# Patient Record
Sex: Female | Born: 1955 | Race: Black or African American | Hispanic: No | Smoking: Never smoker
Health system: Southern US, Community
[De-identification: ages and names within clinical notes are randomized; demographics above are authoritative.]

## PROBLEM LIST (undated history)

## (undated) DIAGNOSIS — E7800 Pure hypercholesterolemia, unspecified: Secondary | ICD-10-CM

## (undated) DIAGNOSIS — I10 Essential (primary) hypertension: Secondary | ICD-10-CM

## (undated) DIAGNOSIS — F32A Depression, unspecified: Secondary | ICD-10-CM

## (undated) HISTORY — PX: HAND SURGERY: SHX662

---

## 2006-08-07 ENCOUNTER — Emergency Department (HOSPITAL_COMMUNITY): Admission: EM | Admit: 2006-08-07 | Discharge: 2006-08-08 | Payer: Self-pay | Admitting: Emergency Medicine

## 2006-08-14 ENCOUNTER — Ambulatory Visit (HOSPITAL_BASED_OUTPATIENT_CLINIC_OR_DEPARTMENT_OTHER): Admission: RE | Admit: 2006-08-14 | Discharge: 2006-08-14 | Payer: Self-pay | Admitting: Orthopedic Surgery

## 2007-11-25 IMAGING — CR DG WRIST COMPLETE 3+V*L*
4 series · 4 of 4 positions shown · non-contrast
Comparison: None

CLINICAL DATA: Left forearm injury, pain and swelling. 
 LEFT WRIST - 4 VIEW:

[view not recorded (1 of 4)]
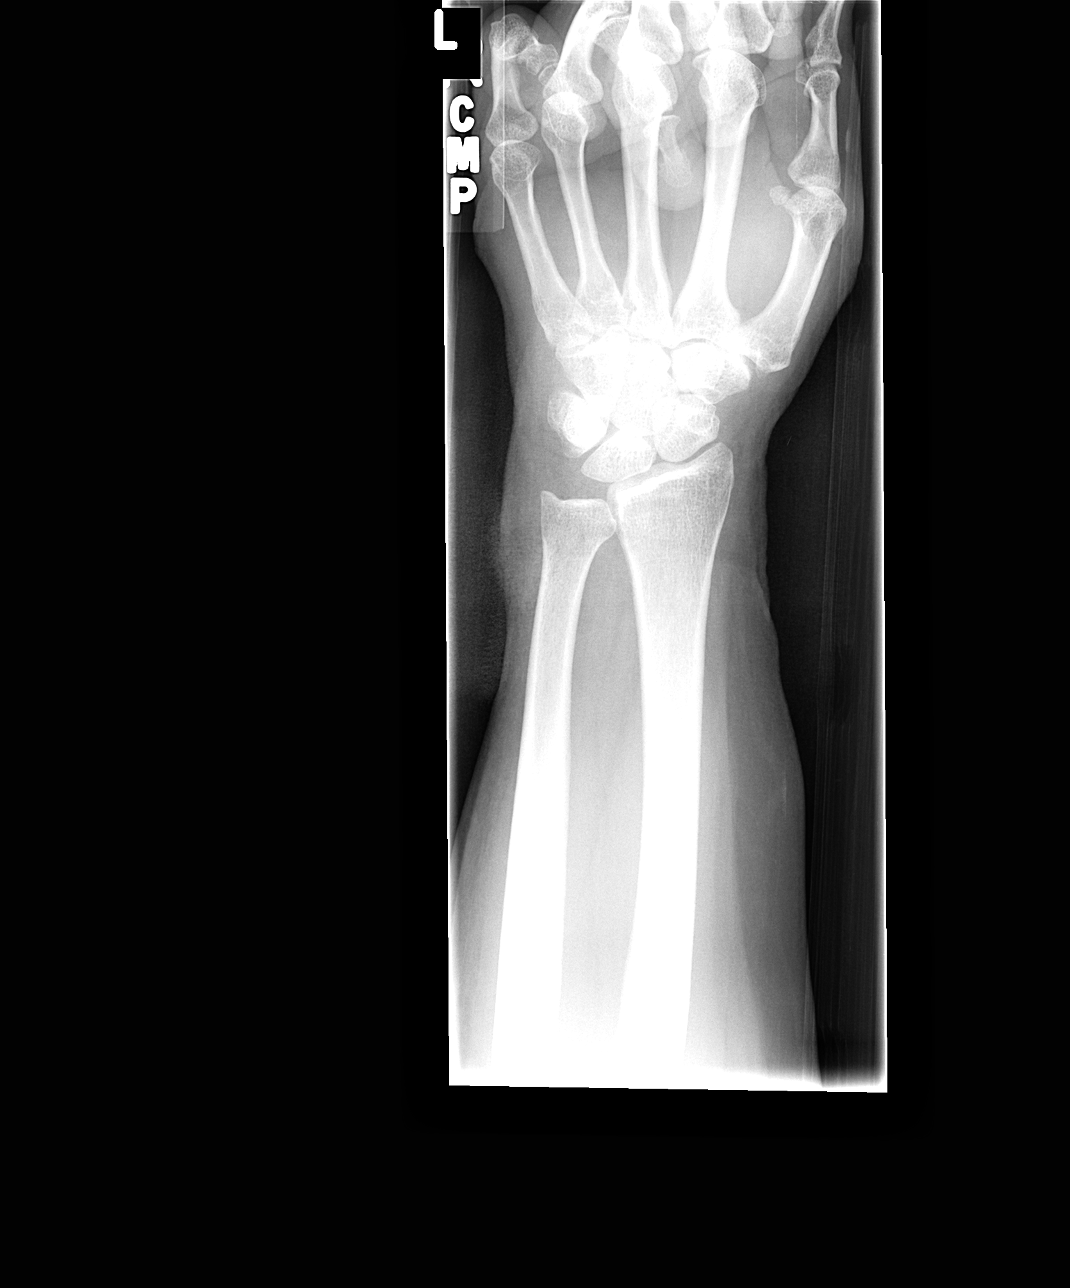

[view not recorded (2 of 4)]
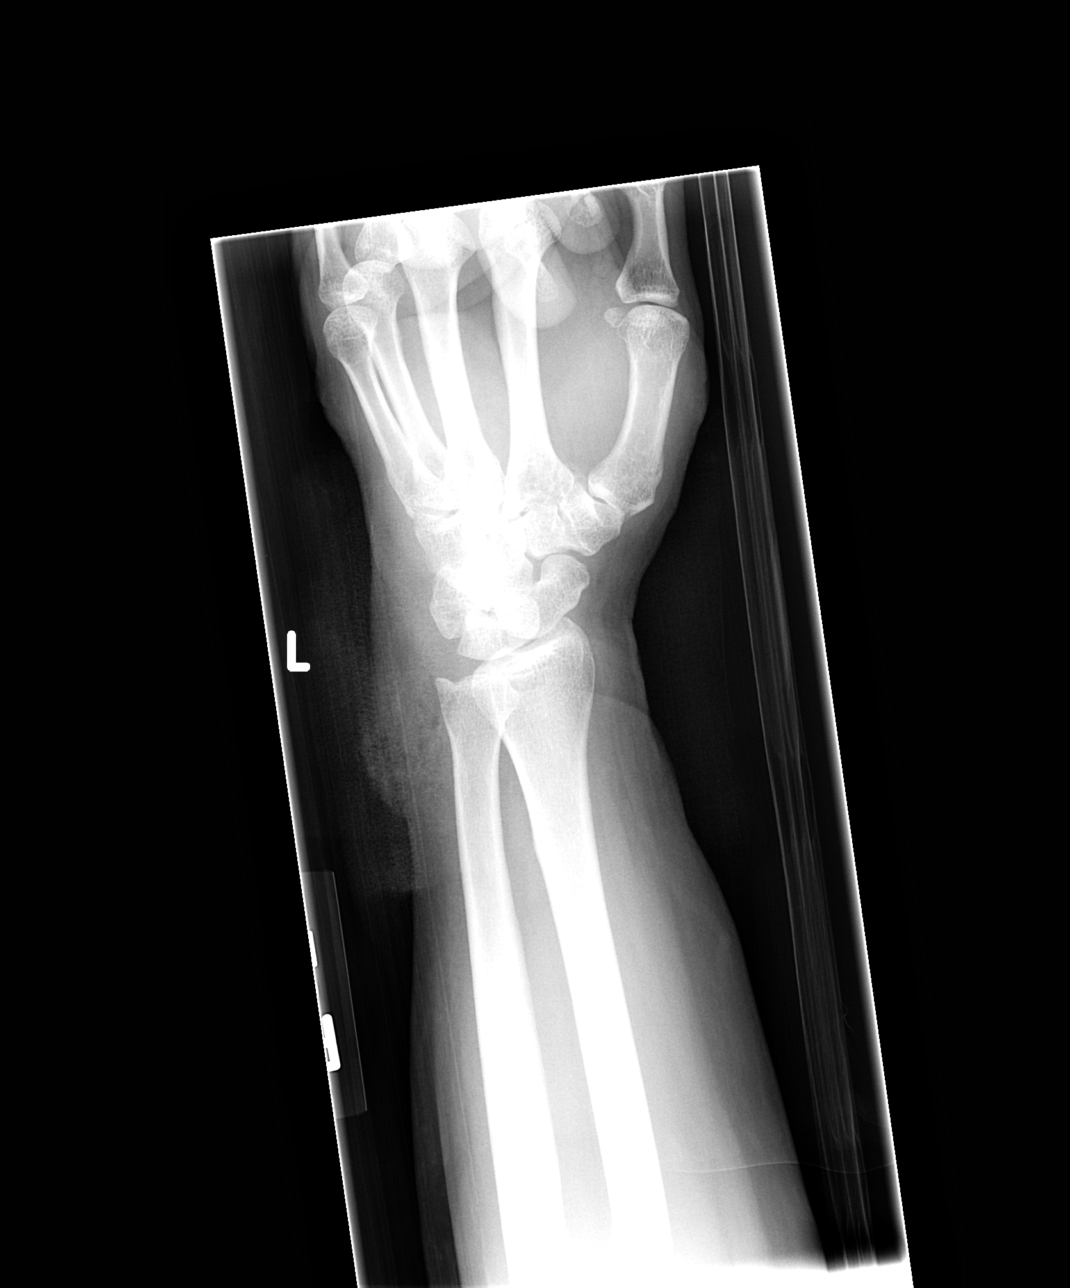

[view not recorded (3 of 4)]
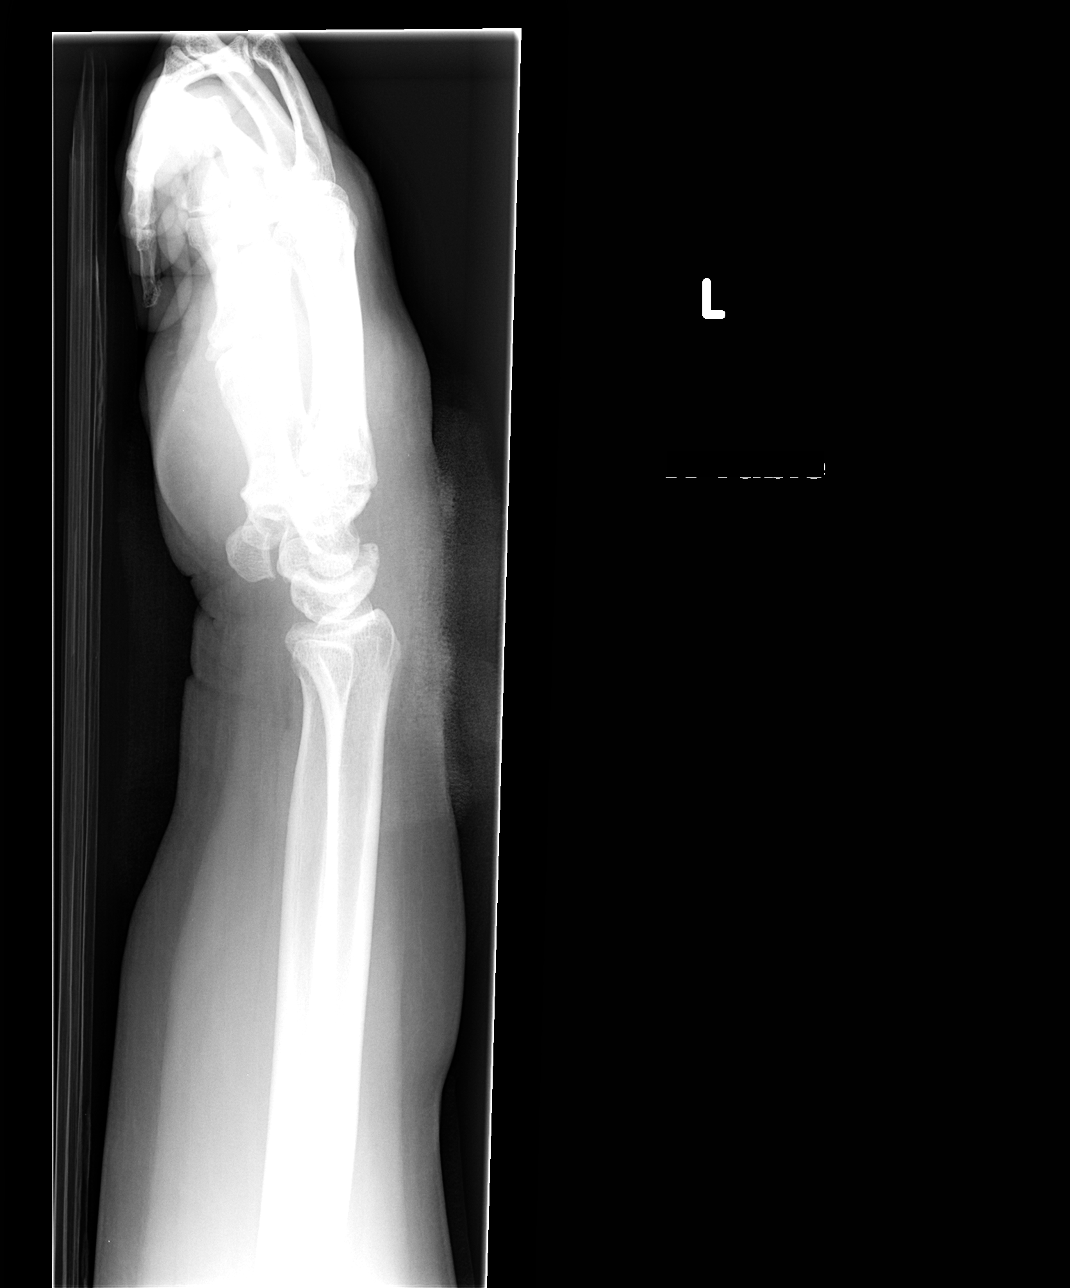

[view not recorded (4 of 4)]
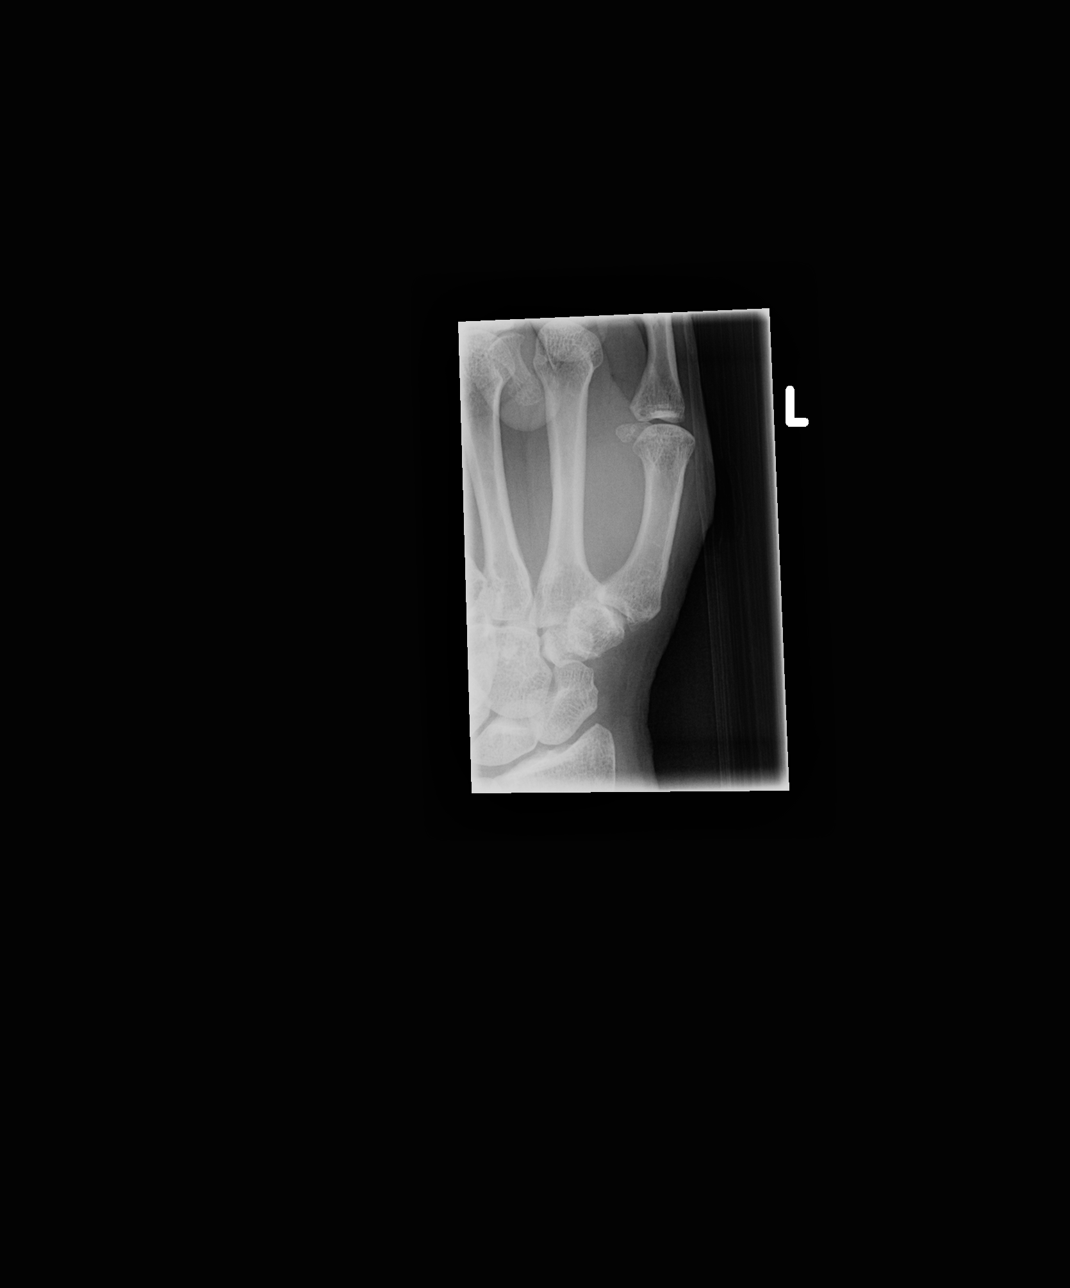

[4 of 4 positions shown; findings below may reference images not displayed]

FINDINGS: Soft tissue swelling is identified without evidence of acute bony abnormality.  There is no evidence of fracture, subluxation, or dislocation.  Degenerative changes at the first carpometacarpal joint are noted.
IMPRESSION: Soft tissue injury without evidence of acute bony abnormality.

## 2016-08-29 ENCOUNTER — Other Ambulatory Visit: Payer: Self-pay | Admitting: Physician Assistant

## 2016-08-29 DIAGNOSIS — Z1231 Encounter for screening mammogram for malignant neoplasm of breast: Secondary | ICD-10-CM

## 2016-10-06 ENCOUNTER — Encounter: Payer: Self-pay | Admitting: Physician Assistant

## 2017-05-02 ENCOUNTER — Telehealth: Payer: Self-pay

## 2017-05-02 NOTE — Telephone Encounter (Signed)
Rec'd from [facility]

## 2017-05-05 ENCOUNTER — Telehealth: Payer: Self-pay | Admitting: Gastroenterology

## 2017-05-08 NOTE — Telephone Encounter (Signed)
Dr.Nandigam reviewed records and accepted for patient to be schedule direct colonoscopy. Patient has been scheduled for a direct colonoscopy.

## 2017-06-21 ENCOUNTER — Telehealth: Payer: Self-pay | Admitting: *Deleted

## 2017-06-21 NOTE — Telephone Encounter (Signed)
Attempted pt twice to [facility] and unsuccessful. No show letter mailed to pt. Left our numbers twice for her to call back and RS.  Bufford Spikes RN PV

## 2017-06-28 ENCOUNTER — Telehealth: Payer: Self-pay | Admitting: Gastroenterology

## 2017-06-28 NOTE — Telephone Encounter (Signed)
patient was a NO SHOW to appointment. Reviewed records will be in "records reviewed" folder.

## 2017-07-05 ENCOUNTER — Encounter: Payer: Self-pay | Admitting: Gastroenterology

## 2019-12-14 ENCOUNTER — Ambulatory Visit: Payer: Self-pay | Attending: Internal Medicine

## 2019-12-14 DIAGNOSIS — Z23 Encounter for immunization: Secondary | ICD-10-CM

## 2019-12-14 NOTE — Progress Notes (Signed)
   Covid-19 Vaccination Clinic  Name:  Chelsea Walters    MRN: [id number] DOB: 08/26/1956  12/14/2019  Chelsea Walters was observed post Covid-19 immunization for 15 minutes without incident. She was provided with Vaccine Information Sheet and instruction to access the V-Safe system.   Chelsea Walters was instructed to call 911 with any severe reactions post vaccine: Marland Kitchen Difficulty breathing  . Swelling of face and throat  . A fast heartbeat  . A bad rash all over body  . Dizziness and weakness   Immunizations Administered    Name Date Dose VIS Date Route   Pfizer COVID-19 Vaccine 12/14/2019  4:23 PM 0.3 mL 08/23/2019 Intramuscular   Manufacturer: [employer], [employer]   Lot: [id number]   NDC: [address]-2

## 2020-01-07 ENCOUNTER — Ambulatory Visit: Payer: Self-pay | Attending: Internal Medicine

## 2020-01-07 DIAGNOSIS — Z23 Encounter for immunization: Secondary | ICD-10-CM

## 2020-01-07 NOTE — Progress Notes (Signed)
   Covid-19 Vaccination Clinic  Name:  MERA GUNKEL    MRN: [id number] DOB: 1955-11-06  01/07/2020  Ms. Kretsch was observed post Covid-19 immunization for 15 minutes without incident. She was provided with Vaccine Information Sheet and instruction to access the V-Safe system.   Ms. Alderete was instructed to call 911 with any severe reactions post vaccine: Marland Kitchen Difficulty breathing  . Swelling of face and throat  . A fast heartbeat  . A bad rash all over body  . Dizziness and weakness   Immunizations Administered    Name Date Dose VIS Date Route   Pfizer COVID-19 Vaccine 01/07/2020 10:44 AM 0.3 mL 11/06/2018 Intramuscular   Manufacturer: [employer], [employer]   Lot: [id number]   NDC: [address]-2

## 2020-10-07 ENCOUNTER — Ambulatory Visit (INDEPENDENT_AMBULATORY_CARE_PROVIDER_SITE_OTHER): Payer: Self-pay | Admitting: Primary Care

## 2020-11-02 ENCOUNTER — Other Ambulatory Visit: Payer: Self-pay

## 2020-11-02 ENCOUNTER — Encounter (INDEPENDENT_AMBULATORY_CARE_PROVIDER_SITE_OTHER): Payer: Self-pay | Admitting: Primary Care

## 2020-11-02 ENCOUNTER — Ambulatory Visit (INDEPENDENT_AMBULATORY_CARE_PROVIDER_SITE_OTHER): Payer: 59 | Admitting: Primary Care

## 2020-11-02 VITALS — BP 159/94 | HR 68 | Temp 97.5°F | Ht 63.0 in | Wt 206.2 lb

## 2020-11-02 DIAGNOSIS — E66812 Obesity, class 2: Secondary | ICD-10-CM

## 2020-11-02 DIAGNOSIS — Z6836 Body mass index (BMI) 36.0-36.9, adult: Secondary | ICD-10-CM

## 2020-11-02 DIAGNOSIS — G8929 Other chronic pain: Secondary | ICD-10-CM

## 2020-11-02 DIAGNOSIS — E785 Hyperlipidemia, unspecified: Secondary | ICD-10-CM | POA: Diagnosis not present

## 2020-11-02 DIAGNOSIS — R030 Elevated blood-pressure reading, without diagnosis of hypertension: Secondary | ICD-10-CM | POA: Diagnosis not present

## 2020-11-02 DIAGNOSIS — M25562 Pain in left knee: Secondary | ICD-10-CM

## 2020-11-02 DIAGNOSIS — E6609 Other obesity due to excess calories: Secondary | ICD-10-CM

## 2020-11-02 DIAGNOSIS — M25561 Pain in right knee: Secondary | ICD-10-CM

## 2020-11-02 DIAGNOSIS — Z79899 Other long term (current) drug therapy: Secondary | ICD-10-CM

## 2020-11-02 DIAGNOSIS — Z7689 Persons encountering health services in other specified circumstances: Secondary | ICD-10-CM

## 2020-11-02 DIAGNOSIS — Z760 Encounter for issue of repeat prescription: Secondary | ICD-10-CM

## 2020-11-02 DIAGNOSIS — F32A Depression, unspecified: Secondary | ICD-10-CM

## 2020-11-02 MED ORDER — BUPROPION HCL 75 MG PO TABS: 75.0000 mg | ORAL_TABLET | Freq: Every day | ORAL | 1 refills | Status: DC

## 2020-11-02 MED ORDER — SERTRALINE HCL 100 MG PO TABS: 100.0000 mg | ORAL_TABLET | Freq: Every day | ORAL | 1 refills | Status: DC

## 2020-11-02 NOTE — Progress Notes (Signed)
New Patient Office Visit  Subjective:  Patient ID: Chelsea Walters, female    DOB: 29-Mar-1956  Age: 65 y.o. MRN: [id number]  CC:  Chief Complaint  Patient presents with  . New Patient (Initial Visit)    HPI Chelsea Walters  Is a 65 year old obese female who presents to establish care.   History reviewed. No pertinent past medical history. History reviewed. No pertinent family history.  Social History   Socioeconomic History  . Marital status: Married    Spouse name: Not on file  . Number of children: Not on file  . Years of education: Not on file  . Highest education level: Not on file  Occupational History  . Not on file  Tobacco Use  . Smoking status: Never Smoker  . Smokeless tobacco: Never Used  Substance and Sexual Activity  . Alcohol use: Never  . Drug use: Never  . Sexual activity: Not Currently  Other Topics Concern  . Not on file  Social History Narrative  . Not on file   Social Determinants of Health   Financial Resource Strain: Not on file  Food Insecurity: Not on file  Transportation Needs: Not on file  Physical Activity: Not on file  Stress: Not on file  Social Connections: Not on file  Intimate Partner Violence: Not on file    ROS Review of Systems  Musculoskeletal: Positive for gait problem and joint swelling.       Bilateral knee pain with crepitus   All other systems reviewed and are negative.   Objective:   Today's Vitals: BP (!) 150/90 (BP Location: Right Arm, Patient Position: Sitting, Cuff Size: Large)   Pulse 66   Temp (!) 97.5 F (36.4 C) (Temporal)   Ht 5' 3"  (1.6 m)   Wt 206 lb 3.2 oz (93.5 kg)   SpO2 97%   BMI 36.53 kg/m   Physical Exam Vitals reviewed.  Constitutional:      Appearance: Normal appearance. She is obese.  HENT:     Head: Normocephalic.     Right Ear: Tympanic membrane and external ear normal.     Left Ear: Tympanic membrane and external ear normal.     Nose: Nose normal.  Eyes:      Extraocular Movements: Extraocular movements intact.     Pupils: Pupils are equal, round, and reactive to light.  Cardiovascular:     Rate and Rhythm: Normal rate and regular rhythm.  Pulmonary:     Effort: Pulmonary effort is normal.     Breath sounds: Normal breath sounds.  Abdominal:     General: Bowel sounds are normal. There is distension.     Palpations: Abdomen is soft.  Musculoskeletal:        General: Normal range of motion.     Cervical back: Normal range of motion and neck supple.     Comments: Crepitus /popping bilateral knees left > right  Skin:    General: Skin is warm and dry.  Neurological:     Mental Status: She is alert and oriented to person, place, and time.  Psychiatric:        Mood and Affect: Mood normal.        Behavior: Behavior normal.        Thought Content: Thought content normal.        Judgment: Judgment normal.     Assessment & Plan:  Chelsea Walters was seen today for new patient (initial visit).  Diagnoses and all  orders for this visit:  Encounter to establish care Establish care  -     Lipid Panel  Class 2 obesity due to excess calories without serious comorbidity with body mass index (BMI) of 36.0 to 36.9 in adult Obesity is 30-39 indicating an excess in caloric intake or underlining conditions. This may lead to other co-morbidities.( knee pain/joint pain, HTN, T2D, and respiratory problems) Lifestyle modifications of diet and exercise may reduce obesity.   Elevated blood-pressure reading without diagnosis of hypertension Counseled on  low-sodium, DASH diet, medication compliance, 150 minutes of moderate intensity exercise per week.  -     CMP14+EGFR -     Cancel: Comprehensive metabolic panel  Hyperlipidemia, unspecified hyperlipidemia type -     Lipid Panel  Depression, unspecified depression type Previously prescribed and feels this works for her  -     buPROPion (WELLBUTRIN) 75 MG tablet; Take 1 tablet (75 mg total) by mouth daily. -      sertraline (ZOLOFT) 100 MG tablet; Take 1 tablet (100 mg total) by mouth daily. -     CMP14+EGFR  Medication refill -     buPROPion (WELLBUTRIN) 75 MG tablet; Take 1 tablet (75 mg total) by mouth daily. -     sertraline (ZOLOFT) 100 MG tablet; Take 1 tablet (100 mg total) by mouth daily.  Chronic pain of both knees .Work on losing weight to help reduce joint pain. May alternate with heat and ice application for pain relief. May also alternate with acetaminophen and Ibuprofen as prescribed pain relief. Other alternatives include massage, acupuncture and water aerobics.  You must stay active and avoid a sedentary lifestyle. Refer to orthopedics    Outpatient Encounter Medications as of 11/02/2020  Medication Sig  . meloxicam (MOBIC) 15 MG tablet Take 1 tablet by mouth daily.  Marland Kitchen atorvastatin (LIPITOR) 10 MG tablet Take 10 mg by mouth daily.  Marland Kitchen buPROPion (WELLBUTRIN) 75 MG tablet Take 75 mg by mouth daily.  . sertraline (ZOLOFT) 100 MG tablet Take 100 mg by mouth daily.   No facility-administered encounter medications on file as of 11/02/2020.    Follow-up: Return for schedule pap .   Kerin Perna, NP

## 2020-11-02 NOTE — Patient Instructions (Signed)

## 2020-11-03 ENCOUNTER — Other Ambulatory Visit (INDEPENDENT_AMBULATORY_CARE_PROVIDER_SITE_OTHER): Payer: Self-pay | Admitting: Primary Care

## 2020-11-03 LAB — CMP14+EGFR
ALT: 17 IU/L (ref 0–32)
AST: 17 IU/L (ref 0–40)
Albumin/Globulin Ratio: 1.3 (ref 1.2–2.2)
Albumin: 4.7 g/dL (ref 3.8–4.8)
Alkaline Phosphatase: 61 IU/L (ref 44–121)
BUN/Creatinine Ratio: 16 (ref 12–28)
BUN: 13 mg/dL (ref 8–27)
Bilirubin Total: 0.5 mg/dL (ref 0.0–1.2)
CO2: 21 mmol/L (ref 20–29)
Calcium: 9.9 mg/dL (ref 8.7–10.3)
Chloride: 101 mmol/L (ref 96–106)
Creatinine, Ser: 0.83 mg/dL (ref 0.57–1.00)
GFR calc Af Amer: 87 mL/min/{1.73_m2} (ref >59)
GFR calc non Af Amer: 75 mL/min/{1.73_m2} (ref >59)
Globulin, Total: 3.6 g/dL (ref 1.5–4.5)
Glucose: 80 mg/dL (ref 65–99)
Potassium: 4.4 mmol/L (ref 3.5–5.2)
Sodium: 141 mmol/L (ref 134–144)
Total Protein: 8.3 g/dL (ref 6.0–8.5)

## 2020-11-03 LAB — LIPID PANEL
Chol/HDL Ratio: 4 ratio (ref 0.0–4.4)
Cholesterol, Total: 170 mg/dL (ref 100–199)
HDL: 43 mg/dL (ref >39)
LDL Chol Calc (NIH): 111 mg/dL — ABNORMAL HIGH (ref 0–99)
Triglycerides: 88 mg/dL (ref 0–149)
VLDL Cholesterol Cal: 16 mg/dL (ref 5–40)

## 2020-11-03 MED ORDER — ATORVASTATIN CALCIUM 10 MG PO TABS: 10.0000 mg | ORAL_TABLET | Freq: Every day | ORAL | 1 refills | Status: DC

## 2020-11-06 ENCOUNTER — Telehealth (INDEPENDENT_AMBULATORY_CARE_PROVIDER_SITE_OTHER): Payer: Self-pay

## 2020-11-06 NOTE — Telephone Encounter (Signed)
Per DPR left voicemail notifying patient of lab results. Return call to [facility] at [phone] with any questions or concerns. Maryjean Morn, CMA

## 2020-11-06 NOTE — Telephone Encounter (Signed)
-----   Message from Grayce Sessions, NP sent at 11/03/2020 12:37 PM EST ----- Your LDL is not in range. Your LDL is the bad cholesterol that can lead to heart attack and stroke. To lower your number you can decrease your fatty foods, red meat, cheese, milk and increase fiber like whole grains and veggies. Sent in atorvastatin 10mg  take at bedtime

## 2020-11-12 ENCOUNTER — Other Ambulatory Visit: Payer: Self-pay

## 2020-11-12 ENCOUNTER — Encounter: Payer: Self-pay | Admitting: Orthopaedic Surgery

## 2020-11-12 ENCOUNTER — Ambulatory Visit (INDEPENDENT_AMBULATORY_CARE_PROVIDER_SITE_OTHER): Payer: 59 | Admitting: Orthopaedic Surgery

## 2020-11-12 ENCOUNTER — Ambulatory Visit (INDEPENDENT_AMBULATORY_CARE_PROVIDER_SITE_OTHER): Payer: 59

## 2020-11-12 ENCOUNTER — Ambulatory Visit: Payer: Self-pay

## 2020-11-12 VITALS — Ht 63.0 in | Wt 206.0 lb

## 2020-11-12 DIAGNOSIS — M25561 Pain in right knee: Secondary | ICD-10-CM

## 2020-11-12 DIAGNOSIS — M25562 Pain in left knee: Secondary | ICD-10-CM

## 2020-11-12 DIAGNOSIS — G8929 Other chronic pain: Secondary | ICD-10-CM | POA: Diagnosis not present

## 2020-11-12 MED ORDER — MELOXICAM 7.5 MG PO TABS: 7.5000 mg | ORAL_TABLET | Freq: Two times a day (BID) | ORAL | 2 refills | Status: DC | PRN

## 2020-11-12 NOTE — Progress Notes (Signed)
Office Visit Note   Patient: Chelsea Walters           Date of Birth: 04/10/1956           MRN: [id number] Visit Date: 11/12/2020              Requested by: Grayce Sessions, NP [address] [place],  [place] [address] PCP: Grayce Sessions, NP   Assessment & Plan: Visit Diagnoses:  1. 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  We had a lengthy discussion on treatment options and based on my findings I have recommended to continue with conservative treatment in the form of physical therapy, weight loss, strengthening, short course of meloxicam, viscosupplementation since she had good relief in the past.  Follow-Up Instructions: Return if symptoms worsen or fail to improve.   Orders:  Orders Placed This Encounter  Procedures  . XR KNEE 3 VIEW LEFT  . XR KNEE 3 VIEW RIGHT   Meds ordered this encounter  Medications  . meloxicam (MOBIC) 7.5 MG tablet    Sig: Take 1 tablet (7.5 mg total) by mouth 2 (two) times daily as needed for pain.    Dispense:  30 tablet    Refill:  2      Procedures: No procedures performed   Clinical Data: No additional findings.   Subjective: Chief Complaint  Patient presents with  . Left Knee - Pain  . Right Knee - Pain    Chelsea Walters is a 65 year old female comes in for evaluation of bilateral knee pain worse on the left.  This has been getting worse over the last 5 years.  Denies any injuries.  She has pain throughout her knee and worse in the medial portion.  Steps are very hard for her due to the pain.  She also has trouble walking and nighttime pain that is severe and constant.  She previously had Visco injections in 2018 which really helped with the pain and decreased her requirement for pain medications.  She is currently retired.  She notices occasional swelling.   Review of Systems  Constitutional: Negative.    HENT: Negative.   Eyes: Negative.   Respiratory: Negative.   Cardiovascular: Negative.   Endocrine: Negative.   Musculoskeletal: Negative.   Neurological: Negative.   Hematological: Negative.   Psychiatric/Behavioral: Negative.   All other systems reviewed and are negative.    Objective: Vital Signs: Ht 5\' 3"  (1.6 m)   Wt 206 lb (93.4 kg)   BMI 36.49 kg/m   Physical Exam Vitals and nursing note reviewed.  Constitutional:      Appearance: She is well-developed and well-nourished.  HENT:     Head: Normocephalic and atraumatic.  Eyes:     Extraocular Movements: EOM normal.  Pulmonary:     Effort: Pulmonary effort is normal.  Abdominal:     Palpations: Abdomen is soft.  Musculoskeletal:     Cervical back: Neck supple.  Skin:    General: Skin is warm.     Capillary Refill: Capillary refill takes less than 2 seconds.  Neurological:     Mental Status: She is alert and oriented to person, place, and time.  Psychiatric:        Mood and Affect: Mood and affect normal.        Behavior: Behavior normal.        Thought Content: Thought content normal.  Judgment: Judgment normal.     Ortho Exam Bilateral knees showed no joint effusion.  1+ crepitus with range of motion that is mildly restricted.  Moderate pain with range of motion.  Collaterals and cruciates are stable. Specialty Comments:  No specialty comments available.  Imaging: XR KNEE 3 VIEW LEFT  Result Date: 11/12/2020 Moderate tricompartmental DJD worst in the patellofemoral compartment.  XR KNEE 3 VIEW RIGHT  Result Date: 11/12/2020 Moderate tricompartmental DJD worst in patellofemoral compartment.    PMFS History: There are no problems to display for this patient.  History reviewed. No pertinent past medical history.  History reviewed. No pertinent family history.  History reviewed. No pertinent surgical history. Social History   Occupational History  . Not on file  Tobacco Use  . Smoking  status: Never Smoker  . Smokeless tobacco: Never Used  Substance and Sexual Activity  . Alcohol use: Never  . Drug use: Never  . Sexual activity: Not Currently

## 2020-12-02 ENCOUNTER — Ambulatory Visit (INDEPENDENT_AMBULATORY_CARE_PROVIDER_SITE_OTHER): Payer: 59 | Admitting: Primary Care

## 2020-12-02 ENCOUNTER — Other Ambulatory Visit: Payer: Self-pay

## 2020-12-02 ENCOUNTER — Encounter (INDEPENDENT_AMBULATORY_CARE_PROVIDER_SITE_OTHER): Payer: Self-pay | Admitting: Primary Care

## 2020-12-02 VITALS — BP 147/89 | HR 72 | Temp 97.3°F | Ht 63.0 in | Wt 202.4 lb

## 2020-12-02 DIAGNOSIS — F32A Depression, unspecified: Secondary | ICD-10-CM

## 2020-12-02 DIAGNOSIS — I10 Essential (primary) hypertension: Secondary | ICD-10-CM | POA: Diagnosis not present

## 2020-12-02 DIAGNOSIS — E7800 Pure hypercholesterolemia, unspecified: Secondary | ICD-10-CM

## 2020-12-02 DIAGNOSIS — E6609 Other obesity due to excess calories: Secondary | ICD-10-CM

## 2020-12-02 DIAGNOSIS — Z6836 Body mass index (BMI) 36.0-36.9, adult: Secondary | ICD-10-CM

## 2020-12-02 MED ORDER — HYDROCHLOROTHIAZIDE 25 MG PO TABS: 25.0000 mg | ORAL_TABLET | Freq: Every day | ORAL | 1 refills | Status: DC

## 2020-12-02 MED ORDER — AMLODIPINE BESYLATE 10 MG PO TABS: 10.0000 mg | ORAL_TABLET | Freq: Every day | ORAL | 1 refills | Status: DC

## 2020-12-02 NOTE — Progress Notes (Signed)
Established Patient Office Visit  Subjective:  Patient ID: Chelsea Walters, female    DOB: March 28, 1956  Age: 65 y.o. MRN: [id number]  CC:  Chief Complaint  Patient presents with  . Blood Pressure Check    HPI Chelsea Walters is a 65 year old obese female who presents for blood pressure follow-up.  Previously, before starting medication agreed upon lifestyle modifications which would include monitoring sodium intake reducing red meats, pork, canned foods and fermented foods .  She states she has reduced her sodium in her diet she has not started exercising as agreed upon but by next visit she will have at least reported.  Denies shortness of breath, headaches, chest pain or lower extremity edema  No past surgical history on file.  No family history on file.  Social History   Socioeconomic History  . Marital status: Married    Spouse name: Not on file  . Number of children: Not on file  . Years of education: Not on file  . Highest education level: Not on file  Occupational History  . Not on file  Tobacco Use  . Smoking status: Never Smoker  . Smokeless tobacco: Never Used  Substance and Sexual Activity  . Alcohol use: Never  . Drug use: Never  . Sexual activity: Not Currently  Other Topics Concern  . Not on file  Social History Narrative  . Not on file   Social Determinants of Health   Financial Resource Strain: Not on file  Food Insecurity: Not on file  Transportation Needs: Not on file  Physical Activity: Not on file  Stress: Not on file  Social Connections: Not on file  Intimate Partner Violence: Not on file    Outpatient Medications Prior to Visit  Medication Sig Dispense Refill  . atorvastatin (LIPITOR) 10 MG tablet Take 1 tablet (10 mg total) by mouth daily. 90 tablet 1  . buPROPion (WELLBUTRIN) 75 MG tablet Take 1 tablet (75 mg total) by mouth daily. 90 tablet 1  . meloxicam (MOBIC) 7.5 MG tablet Take 1 tablet (7.5 mg total) by mouth 2 (two) times  daily as needed for pain. 30 tablet 2  . sertraline (ZOLOFT) 100 MG tablet Take 1 tablet (100 mg total) by mouth daily. 90 tablet 1   No facility-administered medications prior to visit.    No Known Allergies  ROS Review of Systems    Objective:    Physical Exam Vitals reviewed.  Constitutional:      Appearance: She is obese.  HENT:     Head: Normocephalic.     Right Ear: External ear normal.     Left Ear: External ear normal.     Nose: Nose normal.  Eyes:     Extraocular Movements: Extraocular movements intact.  Cardiovascular:     Rate and Rhythm: Normal rate and regular rhythm.  Pulmonary:     Effort: Pulmonary effort is normal.     Breath sounds: Normal breath sounds.  Abdominal:     General: Bowel sounds are normal. There is distension.     Palpations: Abdomen is soft.  Musculoskeletal:        General: Normal range of motion.     Cervical back: Normal range of motion and neck supple.  Skin:    General: Skin is warm and dry.  Neurological:     Mental Status: She is alert and oriented to person, place, and time.  Psychiatric:        Mood and Affect: Mood  normal.        Behavior: Behavior normal.        Thought Content: Thought content normal.        Judgment: Judgment normal.     BP (!) 147/89 (BP Location: Right Arm, Patient Position: Sitting, Cuff Size: Large)   Pulse 72   Temp (!) 97.3 F (36.3 C) (Temporal)   Ht 5\' 3"  (1.6 m)   Wt 202 lb 6.4 oz (91.8 kg)   SpO2 93%   BMI 35.85 kg/m  Wt Readings from Last 3 Encounters:  12/02/20 202 lb 6.4 oz (91.8 kg)  11/12/20 206 lb (93.4 kg)  11/02/20 206 lb 3.2 oz (93.5 kg)     Health Maintenance Due  Topic Date Due  . Hepatitis C Screening  Never done  . HIV Screening  Never done  . PAP SMEAR-Modifier  Never done    There are no preventive care reminders to display for this patient.  No results found for: TSH No results found for: WBC, HGB, HCT, MCV, PLT Lab Results  Component Value Date   NA  141 11/02/2020   K 4.4 11/02/2020   CO2 21 11/02/2020   GLUCOSE 80 11/02/2020   BUN 13 11/02/2020   CREATININE 0.83 11/02/2020   BILITOT 0.5 11/02/2020   ALKPHOS 61 11/02/2020   AST 17 11/02/2020   ALT 17 11/02/2020   PROT 8.3 11/02/2020   ALBUMIN 4.7 11/02/2020   CALCIUM 9.9 11/02/2020   Lab Results  Component Value Date   CHOL 170 11/02/2020   Lab Results  Component Value Date   HDL 43 11/02/2020   Lab Results  Component Value Date   LDLCALC 111 (H) 11/02/2020   Lab Results  Component Value Date   TRIG 88 11/02/2020   Lab Results  Component Value Date   CHOLHDL 4.0 11/02/2020   No results found for: HGBA1C    Assessment & Plan:  [name] was seen today for blood pressure check.  Diagnoses and all orders for this visit:  Essential hypertension Counseled on blood pressure goal of less than 130/80, low-sodium, DASH diet, medication compliance, 150 minutes of moderate intensity exercise per week. Discussed medication compliance, adverse effects. Start on amlodipine 10 mg and hydrochlorothiazide 25 mg daily in a.m.  Elevated LDL cholesterol level Slightly elevated LDL with other comorbidities elevated blood pressure, obesity, will add cholesterol medication to reduce risk of stroke or heart attack.  Previous visit started on atorvastatin 10 mg at bedtime  Class 2 obesity due to excess calories without serious comorbidity with body mass index (BMI) of 36.0 to 36.9 in adult Obesity is 30-39 indicating an excess in caloric intake or underlining conditions. This may lead to other co-morbidities.  This has been discussed risk factors with obesity, high blood pressure and with the weather getting nicer her goal is to start exercising for at least walking on a regular basis which may help reduce obesity.   Depression, unspecified depression type On Wellbutrin and  Zoloft but does not have the energy or feel purposeful. Ex husband died last year. All children are grown and  out the house. She does care for her mom which is elderly.  She does not have any thoughts of harm to herself or others nor hallucinations she has feeling down and purposeful.  Refer for therapy.  Follow-up: No follow-ups on file.    Chelsea Mccreedy, NP

## 2020-12-02 NOTE — Patient Instructions (Signed)
Managing Your Hypertension Hypertension, also called high blood pressure, is when the force of the blood pressing against the walls of the arteries is too strong. Arteries are blood vessels that carry blood from your heart throughout your body. Hypertension forces the heart to work harder to pump blood and may cause the arteries to become narrow or stiff. Understanding blood pressure readings Your personal target blood pressure may vary depending on your medical conditions, your age, and other factors. A blood pressure reading includes a higher number over a lower number. Ideally, your blood pressure should be below 120/80. You should know that:  The first, or top, number is called the systolic pressure. It is a measure of the pressure in your arteries as your heart beats.  The second, or bottom number, is called the diastolic pressure. It is a measure of the pressure in your arteries as the heart relaxes. Blood pressure is classified into four stages. Based on your blood pressure reading, your health care provider may use the following stages to determine what type of treatment you need, if any. Systolic pressure and diastolic pressure are measured in a unit called mmHg. Normal  Systolic pressure: below 120.  Diastolic pressure: below 80. Elevated  Systolic pressure: 120-129.  Diastolic pressure: below 80. Hypertension stage 1  Systolic pressure: 130-139.  Diastolic pressure: 80-89. Hypertension stage 2  Systolic pressure: 140 or above.  Diastolic pressure: 90 or above. How can this condition affect me? Managing your hypertension is an important responsibility. Over time, hypertension can damage the arteries and decrease blood flow to important parts of the body, including the brain, heart, and kidneys. Having untreated or uncontrolled hypertension can lead to:  A heart attack.  A stroke.  A weakened blood vessel (aneurysm).  Heart failure.  Kidney damage.  Eye  damage.  Metabolic syndrome.  Memory and concentration problems.  Vascular dementia. What actions can I take to manage this condition? Hypertension can be managed by making lifestyle changes and possibly by taking medicines. Your health care provider will help you make a plan to bring your blood pressure within a normal range. Nutrition  Eat a diet that is high in fiber and potassium, and low in salt (sodium), added sugar, and fat. An example eating plan is called the Dietary Approaches to Stop Hypertension (DASH) diet. To eat this way: ? Eat plenty of fresh fruits and vegetables. Try to fill one-half of your plate at each meal with fruits and vegetables. ? Eat whole grains, such as whole-wheat pasta, brown rice, or whole-grain bread. Fill about one-fourth of your plate with whole grains. ? Eat low-fat dairy products. ? Avoid fatty cuts of meat, processed or cured meats, and poultry with skin. Fill about one-fourth of your plate with lean proteins such as fish, chicken without skin, beans, eggs, and tofu. ? Avoid pre-made and processed foods. These tend to be higher in sodium, added sugar, and fat.  Reduce your daily sodium intake. Most people with hypertension should eat less than 1,500 mg of sodium a day.   Lifestyle  Work with your health care provider to maintain a healthy body weight or to lose weight. Ask what an ideal weight is for you.  Get at least 30 minutes of exercise that causes your heart to beat faster (aerobic exercise) most days of the week. Activities may include walking, swimming, or biking.  Include exercise to strengthen your muscles (resistance exercise), such as weight lifting, as part of your weekly exercise routine. Try   to do these types of exercises for 30 minutes at least 3 days a week.  Do not use any products that contain nicotine or tobacco, such as cigarettes, e-cigarettes, and chewing tobacco. If you need help quitting, ask your health care  provider.  Control any long-term (chronic) conditions you have, such as high cholesterol or diabetes.  Identify your sources of stress and find ways to manage stress. This may include meditation, deep breathing, or making time for fun activities.   Alcohol use  Do not drink alcohol if: ? Your health care provider tells you not to drink. ? You are pregnant, may be pregnant, or are planning to become pregnant.  If you drink alcohol: ? Limit how much you use to:  0-1 drink a day for women.  0-2 drinks a day for men. ? Be aware of how much alcohol is in your drink. In the [place] one drink equals one 12 oz bottle of beer (355 mL), one 5 oz glass of wine (148 mL), or one 1 oz glass of hard liquor (44 mL). Medicines Your health care provider may prescribe medicine if lifestyle changes are not enough to get your blood pressure under control and if:  Your systolic blood pressure is 130 or higher.  Your diastolic blood pressure is 80 or higher. Take medicines only as told by your health care provider. Follow the directions carefully. Blood pressure medicines must be taken as told by your health care provider. The medicine does not work as well when you skip doses. Skipping doses also puts you at risk for problems. Monitoring Before you monitor your blood pressure:  Do not smoke, drink caffeinated beverages, or exercise within 30 minutes before taking a measurement.  Use the bathroom and empty your bladder (urinate).  Sit quietly for at least 5 minutes before taking measurements. Monitor your blood pressure at home as told by your health care provider. To do this:  Sit with your back straight and supported.  Place your feet flat on the floor. Do not cross your legs.  Support your arm on a flat surface, such as a table. Make sure your upper arm is at heart level.  Each time you measure, take two or three readings one minute apart and record the results. You may also need to have your  blood pressure checked regularly by your health care provider.   General information  Talk with your health care provider about your diet, exercise habits, and other lifestyle factors that may be contributing to hypertension.  Review all the medicines you take with your health care provider because there may be side effects or interactions.  Keep all visits as told by your health care provider. Your health care provider can help you create and adjust your plan for managing your high blood pressure. Where to find more information  National Heart, Lung, and Blood Institute: www.nhlbi.nih.gov  American Heart Association: www.heart.org Contact a health care provider if:  You think you are having a reaction to medicines you have taken.  You have repeated (recurrent) headaches.  You feel dizzy.  You have swelling in your ankles.  You have trouble with your vision. Get help right away if:  You develop a severe headache or confusion.  You have unusual weakness or numbness, or you feel faint.  You have severe pain in your chest or abdomen.  You vomit repeatedly.  You have trouble breathing. These symptoms may represent a serious problem that is an emergency. Do not wait   to see if the symptoms will go away. Get medical help right away. Call your local emergency services (911 in the [place] Do not drive yourself to the hospital. Summary  Hypertension is when the force of blood pumping through your arteries is too strong. If this condition is not controlled, it may put you at risk for serious complications.  Your personal target blood pressure may vary depending on your medical conditions, your age, and other factors. For most people, a normal blood pressure is less than 120/80.  Hypertension is managed by lifestyle changes, medicines, or both.  Lifestyle changes to help manage hypertension include losing weight, eating a healthy, low-sodium diet, exercising more, stopping smoking, and  limiting alcohol. This information is not intended to replace advice given to you by your health care provider. Make sure you discuss any questions you have with your health care provider. Document Revised: 10/04/2019 Document Reviewed: 07/30/2019 Elsevier Patient Education  2021 Elsevier Inc.  

## 2020-12-24 ENCOUNTER — Ambulatory Visit (INDEPENDENT_AMBULATORY_CARE_PROVIDER_SITE_OTHER): Payer: 59 | Admitting: Primary Care

## 2020-12-24 ENCOUNTER — Other Ambulatory Visit: Payer: Self-pay

## 2020-12-24 ENCOUNTER — Encounter (INDEPENDENT_AMBULATORY_CARE_PROVIDER_SITE_OTHER): Payer: Self-pay | Admitting: Primary Care

## 2020-12-24 VITALS — BP 146/87 | HR 65 | Temp 97.7°F | Ht 63.0 in | Wt 200.2 lb

## 2020-12-24 DIAGNOSIS — I10 Essential (primary) hypertension: Secondary | ICD-10-CM | POA: Diagnosis not present

## 2020-12-24 NOTE — Progress Notes (Signed)
[facility]    [name] is a 65 year old obese female who presents for  hypertension evaluation, on previous visit medication was adjusted to include adding HCTZ 25 mg daily and continue amlodipine 10 mg daily.  Patient is able to check her blood pressure at home and her readings. However she did not bring them with her but she feels like her blood pressure has not been this high at home from the readings today of 146/87.  Denies shortness of breath, headaches, chest pain or lower extremity edema, sudden onset, vision changes, unilateral weakness, dizziness, paresthesias  Patient reports adherence with medications.  Current Medication List Current Outpatient Medications on File Prior to Visit  Medication Sig Dispense Refill  . amLODipine (NORVASC) 10 MG tablet Take 1 tablet (10 mg total) by mouth daily. 90 tablet 1  . atorvastatin (LIPITOR) 10 MG tablet Take 1 tablet (10 mg total) by mouth daily. 90 tablet 1  . buPROPion (WELLBUTRIN) 75 MG tablet Take 1 tablet (75 mg total) by mouth daily. 90 tablet 1  . hydrochlorothiazide (HYDRODIURIL) 25 MG tablet Take 1 tablet (25 mg total) by mouth daily. 90 tablet 1  . meloxicam (MOBIC) 7.5 MG tablet Take 1 tablet (7.5 mg total) by mouth 2 (two) times daily as needed for pain. 30 tablet 2  . sertraline (ZOLOFT) 100 MG tablet Take 1 tablet (100 mg total) by mouth daily. 90 tablet 1   No current facility-administered medications on file prior to visit.   Past Medical History  No past medical history on file. Dietary habits include: DASH/LOW CARB  Exercise habits include:walking  Family / Social history:No ASCVD risk factors include- [place]  O:  Physical Exam Vitals reviewed.  Constitutional:      Appearance: She is obese.  HENT:     Head: Normocephalic.     Nose: Nose normal.  Cardiovascular:     Rate and Rhythm: Normal rate and regular rhythm.  Pulmonary:     Effort: Pulmonary effort is normal.     Breath sounds:  Normal breath sounds.  Abdominal:     General: Bowel sounds are normal. There is distension.     Palpations: Abdomen is soft.  Musculoskeletal:        General: Normal range of motion.     Cervical back: Normal range of motion and neck supple.  Skin:    General: Skin is warm and dry.  Neurological:     Mental Status: She is alert and oriented to person, place, and time.  Psychiatric:        Mood and Affect: Mood normal.        Behavior: Behavior normal.        Thought Content: Thought content normal.        Judgment: Judgment normal.      ROS  Last 3 Office BP readings: BP Readings from Last 3 Encounters:  12/24/20 (!) 146/87  12/02/20 (!) 147/89  11/02/20 (!) 159/94    BMET    Component Value Date/Time   NA 141 11/02/2020 1421   K 4.4 11/02/2020 1421   CL 101 11/02/2020 1421   CO2 21 11/02/2020 1421   GLUCOSE 80 11/02/2020 1421   BUN 13 11/02/2020 1421   CREATININE 0.83 11/02/2020 1421   CALCIUM 9.9 11/02/2020 1421   GFRNONAA 75 11/02/2020 1421   GFRAA 87 11/02/2020 1421    Renal function: CrCl cannot be calculated (Patient's most recent lab result is older than the maximum 21 days  allowed.).  Clinical ASCVD: Yes  The 10-year ASCVD risk score Denman George DC Jr., et al., 2013) is: 10.7%   Values used to calculate the score:     Age: 59 years     Sex: Female     Is Non-Hispanic African American: Yes     Diabetic: No     Tobacco smoker: No     Systolic Blood Pressure: 146 mmHg     Is BP treated: Yes     HDL Cholesterol: 43 mg/dL     Total Cholesterol: 170 mg/dL   A/P:Essential hypertension Hypertension longstanding diagnosed currently on HCTZ 25 mg and amlodipine 10 mg both taken daily on current medications. BP Goal = 130/80 mmHg. Patient is adherent with current medications.  -Continued medication next visit she will bring her blood pressure readings -F/u labs ordered -fasting follow-up appointment 3 months -Counseled on lifestyle modifications for blood  pressure control including reduced dietary sodium, increased exercise, adequate sleep  Chelsea Walters

## 2021-02-22 ENCOUNTER — Ambulatory Visit
Admission: RE | Admit: 2021-02-22 | Discharge: 2021-02-22 | Disposition: A | Discharge status: Home / Self Care | Payer: 59 | Source: Ambulatory Visit | Attending: Student | Admitting: Student

## 2021-02-22 ENCOUNTER — Other Ambulatory Visit: Payer: Self-pay

## 2021-02-22 VITALS — BP 142/84 | HR 86 | Temp 99.3°F | Resp 22

## 2021-02-22 DIAGNOSIS — J069 Acute upper respiratory infection, unspecified: Secondary | ICD-10-CM

## 2021-02-22 DIAGNOSIS — J208 Acute bronchitis due to other specified organisms: Secondary | ICD-10-CM

## 2021-02-22 DIAGNOSIS — H66001 Acute suppurative otitis media without spontaneous rupture of ear drum, right ear: Secondary | ICD-10-CM

## 2021-02-22 HISTORY — DX: Essential (primary) hypertension: I10

## 2021-02-22 HISTORY — DX: Pure hypercholesterolemia, unspecified: E78.00

## 2021-02-22 HISTORY — DX: Depression, unspecified: F32.A

## 2021-02-22 MED ORDER — PREDNISONE 20 MG PO TABS: 40.0000 mg | ORAL_TABLET | Freq: Every day | ORAL | 0 refills | Status: AC

## 2021-02-22 MED ORDER — ALBUTEROL SULFATE HFA 108 (90 BASE) MCG/ACT IN AERS: 1.0000 | INHALATION_SPRAY | Freq: Four times a day (QID) | RESPIRATORY_TRACT | 0 refills | Status: DC | PRN

## 2021-02-22 MED ORDER — AMOXICILLIN-POT CLAVULANATE 875-125 MG PO TABS: 1.0000 | ORAL_TABLET | Freq: Two times a day (BID) | ORAL | 0 refills | Status: DC

## 2021-02-22 NOTE — ED Triage Notes (Signed)
Patient presents to Urgent Care with complaints of cough since last weds and bilateral ear pain x 2 days ago. [facility] treated for URI. She believes this may be URI. Treating symptoms with cough med and albuterol with no relief.

## 2021-02-22 NOTE — Discharge Instructions (Addendum)
-  Start the antibiotic-Augmentin (amoxicillin-clavulanate), 1 pill every 12 hours for 7 days.  You can take this with food like with breakfast and dinner. -Prednisone, 2 pills taken at the same time for 5 days in a row.  Try taking this earlier in the day as it can give you energy.  -I refilled your albuterol inhaler -Seek additional medical attention if you develop new/worsening symptoms like chest pain, shortness of breath, new fevers/chills, dizziness, weakness

## 2021-02-22 NOTE — ED Provider Notes (Signed)
[facility]    CSN: [id number] Arrival date & time: 02/22/21  0940      History   Chief Complaint Chief Complaint  Patient presents with   Cough   Otalgia    Bilateral ear pain      HPI Chelsea Walters is a 65 y.o. female presenting with cough, bilateral ear pain. Medical history hyperlipidemia, hypertension.  Notes cough for about 1 week, productive of yellow sputum.  Bilateral ear pain right worse than left for 2 days, getting worse.  Does endorse right ear muffled hearing, denies tinnitus or dizziness.  Denies fevers or chills.  Last dose of Advil for pain was 12 hours ago.  Denies shortness of breath.  Denies history of pulmonary disease but states she was prescribed an albuterol inhaler for similar issue 1 year ago and this did help. Denies fevers/chills, n/v/d, shortness of breath, chest pain,  facial pain, teeth pain, headaches, sore throat, loss of taste/smell, swollen lymph nodes.   HPI  Past Medical History:  Diagnosis Date   Depression    High cholesterol    Hypertension     There are no problems to display for this patient.   Past Surgical History:  Procedure Laterality Date   HAND SURGERY Left     OB History   No obstetric history on file.      Home Medications    Prior to Admission medications   Medication Sig Start Date End Date Taking? Authorizing Provider  albuterol (VENTOLIN HFA) 108 (90 Base) MCG/ACT inhaler Inhale 1-2 puffs into the lungs every 6 (six) hours as needed for wheezing or shortness of breath. 02/22/21  Yes Rhys Martini, PA-C  amoxicillin-clavulanate (AUGMENTIN) 875-125 MG tablet Take 1 tablet by mouth every 12 (twelve) hours. 02/22/21  Yes Rhys Martini, PA-C  predniSONE (DELTASONE) 20 MG tablet Take 2 tablets (40 mg total) by mouth daily for 5 days. 02/22/21 02/27/21 Yes Rhys Martini, PA-C  amLODipine (NORVASC) 10 MG tablet Take 1 tablet (10 mg total) by mouth daily. 12/02/20   Grayce Sessions, NP   atorvastatin (LIPITOR) 10 MG tablet Take 1 tablet (10 mg total) by mouth daily. 11/03/20   Grayce Sessions, NP  buPROPion (WELLBUTRIN) 75 MG tablet Take 1 tablet (75 mg total) by mouth daily. 11/02/20   Grayce Sessions, NP  hydrochlorothiazide (HYDRODIURIL) 25 MG tablet Take 1 tablet (25 mg total) by mouth daily. 12/02/20   Grayce Sessions, NP  meloxicam (MOBIC) 7.5 MG tablet Take 1 tablet (7.5 mg total) by mouth 2 (two) times daily as needed for pain. 11/12/20   Tarry Kos, MD  sertraline (ZOLOFT) 100 MG tablet Take 1 tablet (100 mg total) by mouth daily. 11/02/20   Grayce Sessions, NP    Family History History reviewed. No pertinent family history.  Social History Social History   Tobacco Use   Smoking status: Never   Smokeless tobacco: Never  Substance Use Topics   Alcohol use: Never   Drug use: Never     Allergies   Patient has no known allergies.   Review of Systems Review of Systems  Constitutional:  Negative for appetite change, chills and fever.  HENT:  Positive for congestion and ear pain. Negative for rhinorrhea, sinus pressure, sinus pain, sore throat, tinnitus, trouble swallowing and voice change.   Eyes:  Negative for redness and visual disturbance.  Respiratory:  Positive for cough. Negative for chest tightness, shortness of breath and wheezing.  Cardiovascular:  Negative for chest pain and palpitations.  Gastrointestinal:  Negative for abdominal pain, constipation, diarrhea, nausea and vomiting.  Genitourinary:  Negative for dysuria, frequency and urgency.  Musculoskeletal:  Negative for myalgias.  Neurological:  Negative for dizziness, weakness and headaches.  Psychiatric/Behavioral:  Negative for confusion.   All other systems reviewed and are negative.   Physical Exam Triage Vital Signs ED Triage Vitals [02/22/21 1006]  Enc Vitals Group     BP (!) 142/84     Pulse Rate 86     Resp 16     Temp 99.3 F (37.4 C)     Temp Source Oral      SpO2 96 %     Weight      Height      Head Circumference      Peak Flow      Pain Score      Pain Loc      Pain Edu?      Excl. in GC?    No data found.  Updated Vital Signs BP (!) 142/84 (BP Location: Left Arm)   Pulse 86   Temp 99.3 F (37.4 C) (Oral)   Resp (!) 22   SpO2 96%   Visual Acuity Right Eye Distance:   Left Eye Distance:   Bilateral Distance:    Right Eye Near:   Left Eye Near:    Bilateral Near:     Physical Exam Vitals reviewed.  Constitutional:      General: She is not in acute distress.    Appearance: Normal appearance. She is not ill-appearing.  HENT:     Head: Normocephalic and atraumatic.     Right Ear: Hearing, ear canal and external ear normal. Tenderness present. No swelling. There is no impacted cerumen. No mastoid tenderness. Tympanic membrane is erythematous and bulging. Tympanic membrane is not perforated or retracted.     Left Ear: Hearing, tympanic membrane, ear canal and external ear normal. No swelling or tenderness. There is no impacted cerumen. No mastoid tenderness. Tympanic membrane is not perforated, erythematous, retracted or bulging.     Nose:     Right Sinus: No maxillary sinus tenderness or frontal sinus tenderness.     Left Sinus: No maxillary sinus tenderness or frontal sinus tenderness.     Mouth/Throat:     Mouth: Mucous membranes are moist.     Pharynx: Uvula midline. No oropharyngeal exudate or posterior oropharyngeal erythema.     Tonsils: No tonsillar exudate.  Cardiovascular:     Rate and Rhythm: Normal rate and regular rhythm.     Heart sounds: Normal heart sounds.  Pulmonary:     Effort: Pulmonary effort is normal. No tachypnea, bradypnea, accessory muscle usage, prolonged expiration or respiratory distress.     Breath sounds: Normal air entry. Rhonchi present. No decreased breath sounds, wheezing or rales.     Comments: Few rhonchi bilateral lower lung fields Frequent hacking cough  Chest:     Chest wall: No  tenderness.  Abdominal:     General: Abdomen is flat. Bowel sounds are normal.     Tenderness: There is no abdominal tenderness. There is no guarding or rebound.  Lymphadenopathy:     Cervical: No cervical adenopathy.  Neurological:     General: No focal deficit present.     Mental Status: She is alert and oriented to person, place, and time.  Psychiatric:        Attention and Perception: Attention and perception normal.  Mood and Affect: Mood and affect normal.        Behavior: Behavior normal. Behavior is cooperative.        Thought Content: Thought content normal.        Judgment: Judgment normal.     UC Treatments / Results  Labs (all labs ordered are listed, but only abnormal results are displayed) Labs Reviewed - No data to display  EKG   Radiology No results found.  Procedures Procedures (including critical care time)  Medications Ordered in UC Medications - No data to display  Initial Impression / Assessment and Plan / UC Course  I have reviewed the triage vital signs and the nursing notes.  Pertinent labs & imaging results that were available during my care of the patient were reviewed by me and considered in my medical decision making (see chart for details).     This patient is a 65 year old female presenting with viral bronchitis and right otitis media following viral URI.  Today she is afebrile, nontachycardic, nontachypneic, oxygenating well on room air without wheezes rhonchi or rales.  Declines COVID PCR.  She does not have diagnosis of pulmonary disease.  Sent prednisone, albuterol, Augmentin as below.  ED return precautions discussed. Patient verbalizes understanding and agreement.     Final Clinical Impressions(s) / UC Diagnoses   Final diagnoses:  Viral bronchitis  Non-recurrent acute suppurative otitis media of right ear without spontaneous rupture of tympanic membrane  Viral URI with cough     Discharge Instructions       -Start the antibiotic-Augmentin (amoxicillin-clavulanate), 1 pill every 12 hours for 7 days.  You can take this with food like with breakfast and dinner. -Prednisone, 2 pills taken at the same time for 5 days in a row.  Try taking this earlier in the day as it can give you energy.  -I refilled your albuterol inhaler -Seek additional medical attention if you develop new/worsening symptoms like chest pain, shortness of breath, new fevers/chills, dizziness, weakness     ED Prescriptions     Medication Sig Dispense Auth. Provider   predniSONE (DELTASONE) 20 MG tablet Take 2 tablets (40 mg total) by mouth daily for 5 days. 10 tablet Rhys Martini, PA-C   albuterol (VENTOLIN HFA) 108 (90 Base) MCG/ACT inhaler Inhale 1-2 puffs into the lungs every 6 (six) hours as needed for wheezing or shortness of breath. 1 each Rhys Martini, PA-C   amoxicillin-clavulanate (AUGMENTIN) 875-125 MG tablet Take 1 tablet by mouth every 12 (twelve) hours. 14 tablet Rhys Martini, PA-C      PDMP not reviewed this encounter.   Rhys Martini, PA-C 02/22/21 [phone]

## 2021-03-26 ENCOUNTER — Ambulatory Visit (INDEPENDENT_AMBULATORY_CARE_PROVIDER_SITE_OTHER): Payer: 59 | Admitting: Primary Care

## 2021-03-26 ENCOUNTER — Other Ambulatory Visit: Payer: Self-pay

## 2021-03-26 ENCOUNTER — Encounter (INDEPENDENT_AMBULATORY_CARE_PROVIDER_SITE_OTHER): Payer: Self-pay | Admitting: Primary Care

## 2021-03-26 VITALS — BP 125/81 | HR 70 | Temp 97.3°F | Ht 63.0 in | Wt 190.2 lb

## 2021-03-26 DIAGNOSIS — E6609 Other obesity due to excess calories: Secondary | ICD-10-CM | POA: Diagnosis not present

## 2021-03-26 DIAGNOSIS — E785 Hyperlipidemia, unspecified: Secondary | ICD-10-CM | POA: Diagnosis not present

## 2021-03-26 DIAGNOSIS — I10 Essential (primary) hypertension: Secondary | ICD-10-CM

## 2021-03-26 DIAGNOSIS — Z23 Encounter for immunization: Secondary | ICD-10-CM | POA: Diagnosis not present

## 2021-03-26 DIAGNOSIS — Z6836 Body mass index (BMI) 36.0-36.9, adult: Secondary | ICD-10-CM

## 2021-03-26 NOTE — Patient Instructions (Addendum)
Shingles  Shingles is an infection. It gives you a painful skin rash and blisters that have fluid in them. Shingles is caused by the same germ (virus) that causes chickenpox. Shingles only happens in people who: Have had chickenpox. Have been given a shot (vaccine) to protect against chickenpox. Shingles is rare in this group. What are the causes? This condition is caused by varicella-zoster virus. This is the same germ that causes chickenpox. After a person is exposed to the germ, the germ stays in the body but is not active (dormant). Shingles develops if the germ becomes active again (is reactivated). This can happen many years after the first exposure to the germ. It is notknown what causes this germ to become active again. What increases the risk? People who have had chickenpox or received the chickenpox shot are at risk for shingles. This infection is more common in people who: Are older than 64 years of age. Have a weakened disease-fighting system (immune system), such as people with: HIV (human immunodeficiency virus). AIDS (acquired immunodeficiency syndrome). Cancer. Are taking medicines that weaken the immune system, such as organ transplant medicines. Have a lot of stress. What are the signs or symptoms? The first symptoms of shingles may be itching, tingling, or pain in an area onyour skin. A rash will show on your skin a few days or weeks later. This is what usually happens: The rash is likely to be on one side of your body. The rash usually has a shape like a belt or a band. Over time, the rash turns into fluid-filled blisters. The blisters will break open and change into scabs. The scabs usually dry up in about 2-3 weeks. You may also have: A fever. Chills. A headache. A feeling like you may vomit (nausea). How is this treated? The rash may last for several weeks. There is not a specific cure for thiscondition. Your doctor may prescribe medicines. Medicines may: Help  with pain. Help you get better sooner. Help to prevent long-term problems. Help with itching (antihistamines). If the area involved is on your face, you may need to see a specialist. Thismay be an eye doctor or an ear, nose, and throat (ENT) doctor. Follow these instructions at home: Medicines Take over-the-counter and prescription medicines only as told by your doctor. Put on an anti-itch cream or numbing cream where you have a rash, blisters, or scabs. Do this as told by your doctor. Helping with itching and discomfort  Put cold, wet cloths (cold compresses) on the area of the rash or blisters as told by your doctor. Cool baths can help you feel better. Try adding baking soda or dry oatmeal to the water to lessen itching. Do not bathe in hot water. Use calamine lotion as told by your doctor.  Blister and rash care Keep your rash covered with a loose bandage (dressing). Wear loose clothing that does not rub on your rash. Wash your hands with soap and water for at least 20 seconds before and after you change your bandage. If you cannot use soap and water, use hand sanitizer. Change your bandage as told by your doctor. Keep your rash and blisters clean. To do this, wash the area with mild soap and cool water as told by your doctor. Check your rash every day for signs of infection. Check for: More redness, swelling, or pain. Fluid or blood. Warmth. Pus or a bad smell. Do not scratch your rash. Do not pick at your blisters. To help you to   not scratch: Keep your fingernails clean and cut short. Wear gloves or mittens when you sleep, if scratching is a problem. General instructions Rest as told by your doctor. Wash your hands often with soap and water for at least 20 seconds. If you cannot use soap and water, use hand sanitizer. Doing this lowers your chance of getting a skin infection. Your infection can cause chickenpox in people who have never had chickenpox or never got a chickenpox  vaccine shot. If you have blisters that did not change into scabs yet, try not to touch other people or be around other people, especially: Babies. Pregnant women. Children who have areas of red, itchy, or rough skin (eczema). Older people who have organ transplants. People who have a long-term (chronic) illness, like cancer or AIDS. Keep all follow-up visits. How is this prevented? A vaccine shot is the best way to prevent shingles and protect against shinglesproblems. If you have not had a vaccine shot, talk with your doctor about getting it. Where to find more information Centers for Disease Control and Prevention: FootballExhibition.com.br Contact a doctor if: Your pain does not get better with medicine. Your pain does not get better after the rash heals. You have any of these signs of infection around the rash: More redness, swelling, or pain. Fluid or blood. Warmth. Pus or a bad smell. You have a fever. Get help right away if: The rash is on your face or nose. You have pain in your face or pain by your eye. You lose feeling on one side of your face. You have trouble seeing. You have ear pain, or you have ringing in your ear. You have a Walters of taste. Your condition gets worse. Summary Shingles gives you a painful skin rash and blisters that have fluid in them. Shingles is caused by the same germ (virus) that causes chickenpox. Keep your rash covered with a loose bandage. Wear loose clothing that does not rub on your rash. If you have blisters that did not change into scabs yet, try not to touch other people or be around people. This information is not intended to replace advice given to you by your health care provider. Make sure you discuss any questions you have with your healthcare provider. Document Revised: 08/24/2020 Document Reviewed: 08/24/2020 Elsevier Patient Education  2022 [employer]  Calorie Counting for Chelsea Walters Calories are units of energy. Your body needs a  certain number of calories from food to keep going throughout the day. When you eat or drink more calories than your body needs, your body stores the extra calories mostly as fat. When you eat or drink fewer calories than your body needs, your body burns fat to getthe energy it needs. Calorie counting means keeping track of how many calories you eat and drink each day. Calorie counting can be helpful if you need to lose weight. If you eat fewer calories than your body needs, you should lose weight. Ask yourhealth care provider what a healthy weight is for you. For calorie counting to work, you will need to eat the right number of calories each day to lose a healthy amount of weight per week. A dietitian can help you figure out how many calories you need in a day and will suggest ways to reach your calorie goal. A healthy amount of weight to lose each week is usually 1-2 lb (0.5-0.9 kg). This usually means that your daily calorie intake should be reduced by 500-750 calories. Eating 1,200-1,500 calories  a day can help most women lose weight. Eating 1,500-1,800 calories a day can help most men lose weight. What do I need to know about calorie counting? Work with your health care provider or dietitian to determine how many calories you should get each day. To meet your daily calorie goal, you will need to: Find out how many calories are in each food that you would like to eat. Try to do this before you eat. Decide how much of the food you plan to eat. Keep a food log. Do this by writing down what you ate and how many calories it had. To successfully lose weight, it is important to balance calorie counting with ahealthy lifestyle that includes regular activity. Where do I find calorie information?  The number of calories in a food can be found on a Nutrition Facts label. If a food does not have a Nutrition Facts label, try to look up the calories onlineor ask your dietitian for help. Remember that calories  are listed per serving. If you choose to have more than one serving of a food, you will have to multiply the calories per serving by the number of servings you plan to eat. For example, the label on a package of bread might say that a serving size is 1 slice and that there are 90 calories in a serving. If you eat 1 slice, you will have eaten 90 calories. If you eat 2slices, you will have eaten 180 calories. How do I keep a food log? After each time that you eat, record the following in your food log as soon as possible: What you ate. Be sure to include toppings, sauces, and other extras on the food. How much you ate. This can be measured in cups, ounces, or number of items. How many calories were in each food and drink. The total number of calories in the food you ate. Keep your food log near you, such as in a pocket-sized notebook or on an app or website on your mobile phone. Some programs will calculate calories for you andshow you how many calories you have left to meet your daily goal. What are some portion-control tips? Know how many calories are in a serving. This will help you know how many servings you can have of a certain food. Use a measuring cup to measure serving sizes. You could also try weighing out portions on a kitchen scale. With time, you will be able to estimate serving sizes for some foods. Take time to put servings of different foods on your favorite plates or in your favorite bowls and cups so you know what a serving looks like. Try not to eat straight from a food's packaging, such as from a bag or box. Eating straight from the package makes it hard to see how much you are eating and can lead to overeating. Put the amount you would like to eat in a cup or on a plate to make sure you are eating the right portion. Use smaller plates, glasses, and bowls for smaller portions and to prevent overeating. Try not to multitask. For example, avoid watching TV or using your computer while  eating. If it is time to eat, sit down at a table and enjoy your food. This will help you recognize when you are full. It will also help you be more mindful of what and how much you are eating. What are tips for following this plan? Reading food labels Check the calorie count compared  with the serving size. The serving size may be smaller than what you are used to eating. Check the source of the calories. Try to choose foods that are high in protein, fiber, and vitamins, and low in saturated fat, trans fat, and sodium. Shopping Read nutrition labels while you shop. This will help you make healthy decisions about which foods to buy. Pay attention to nutrition labels for low-fat or fat-free foods. These foods sometimes have the same number of calories or more calories than the full-fat versions. They also often have added sugar, starch, or salt to make up for flavor that was removed with the fat. Make a grocery list of lower-calorie foods and stick to it. Cooking Try to cook your favorite foods in a healthier way. For example, try baking instead of frying. Use low-fat dairy products. Meal planning Use more fruits and vegetables. One-half of your plate should be fruits and vegetables. Include lean proteins, such as chicken, [place], and fish. Lifestyle Each week, aim to do one of the following: 150 minutes of moderate exercise, such as walking. 75 minutes of vigorous exercise, such as running. General information Know how many calories are in the foods you eat most often. This will help you calculate calorie counts faster. Find a way of tracking calories that works for you. Get creative. Try different apps or programs if writing down calories does not work for you. What foods should I eat?  Eat nutritious foods. It is better to have a nutritious, high-calorie food, such as an avocado, than a food with few nutrients, such as a bag of potato chips. Use your calories on foods and drinks that will  fill you up and will not leave you hungry soon after eating. Examples of foods that fill you up are nuts and nut butters, vegetables, lean proteins, and high-fiber foods such as whole grains. High-fiber foods are foods with more than 5 g of fiber per serving. Pay attention to calories in drinks. Low-calorie drinks include water and unsweetened drinks. The items listed above may not be a complete list of foods and beverages you can eat. Contact a dietitian for more information. What foods should I limit? Limit foods or drinks that are not good sources of vitamins, minerals, or protein or that are high in unhealthy fats. These include: Candy. Other sweets. Sodas, specialty coffee drinks, alcohol, and juice. The items listed above may not be a complete list of foods and beverages you should avoid. Contact a dietitian for more information. How do I count calories when eating out? Pay attention to portions. Often, portions are much larger when eating out. Try these tips to keep portions smaller: Consider sharing a meal instead of getting your own. If you get your own meal, eat only half of it. Before you start eating, ask for a container and put half of your meal into it. When available, consider ordering smaller portions from the menu instead of full portions. Pay attention to your food and drink choices. Knowing the way food is cooked and what is included with the meal can help you eat fewer calories. If calories are listed on the menu, choose the lower-calorie options. Choose dishes that include vegetables, fruits, whole grains, low-fat dairy products, and lean proteins. Choose items that are boiled, broiled, grilled, or steamed. Avoid items that are buttered, battered, fried, or served with cream sauce. Items labeled as crispy are usually fried, unless stated otherwise. Choose water, low-fat milk, unsweetened iced tea, or other drinks without  added sugar. If you want an alcoholic beverage, choose a  lower-calorie option, such as a glass of wine or light beer. Ask for dressings, sauces, and syrups on the side. These are usually high in calories, so you should limit the amount you eat. If you want a salad, choose a garden salad and ask for grilled meats. Avoid extra toppings such as bacon, cheese, or fried items. Ask for the dressing on the side, or ask for olive oil and vinegar or lemon to use as dressing. Estimate how many servings of a food you are given. Knowing serving sizes will help you be aware of how much food you are eating at restaurants. Where to find more information Centers for Disease Control and Prevention: FootballExhibition.com.br [place] Department of Agriculture: WrestlingReporter.dk Summary Calorie counting means keeping track of how many calories you eat and drink each day. If you eat fewer calories than your body needs, you should lose weight. A healthy amount of weight to lose per week is usually 1-2 lb (0.5-0.9 kg). This usually means reducing your daily calorie intake by 500-750 calories. The number of calories in a food can be found on a Nutrition Facts label. If a food does not have a Nutrition Facts label, try to look up the calories online or ask your dietitian for help. Use smaller plates, glasses, and bowls for smaller portions and to prevent overeating. Use your calories on foods and drinks that will fill you up and not leave you hungry shortly after a meal. This information is not intended to replace advice given to you by your health care provider. Make sure you discuss any questions you have with your healthcare provider. Document Revised: 10/10/2019 Document Reviewed: 10/10/2019 Elsevier Patient Education  2022 [employer].

## 2021-03-27 LAB — CBC WITH DIFFERENTIAL/PLATELET
Basophils Absolute: 0 10*3/uL (ref 0.0–0.2)
Basos: 0 %
EOS (ABSOLUTE): 0.2 10*3/uL (ref 0.0–0.4)
Eos: 3 %
Hematocrit: 39.8 % (ref 34.0–46.6)
Hemoglobin: 13.2 g/dL (ref 11.1–15.9)
Immature Grans (Abs): 0 10*3/uL (ref 0.0–0.1)
Immature Granulocytes: 0 %
Lymphocytes Absolute: 2.2 10*3/uL (ref 0.7–3.1)
Lymphs: 37 %
MCH: 29.3 pg (ref 26.6–33.0)
MCHC: 33.2 g/dL (ref 31.5–35.7)
MCV: 88 fL (ref 79–97)
Monocytes Absolute: 0.5 10*3/uL (ref 0.1–0.9)
Monocytes: 9 %
Neutrophils Absolute: 3 10*3/uL (ref 1.4–7.0)
Neutrophils: 51 %
Platelets: 341 10*3/uL (ref 150–450)
RBC: 4.51 x10E6/uL (ref 3.77–5.28)
RDW: 12.3 % (ref 11.7–15.4)
WBC: 5.9 10*3/uL (ref 3.4–10.8)

## 2021-03-27 LAB — CMP14+EGFR
ALT: 21 IU/L (ref 0–32)
AST: 17 IU/L (ref 0–40)
Albumin/Globulin Ratio: 1.3 (ref 1.2–2.2)
Albumin: 4.1 g/dL (ref 3.8–4.8)
Alkaline Phosphatase: 48 IU/L (ref 44–121)
BUN/Creatinine Ratio: 16 (ref 12–28)
BUN: 14 mg/dL (ref 8–27)
Bilirubin Total: 0.6 mg/dL (ref 0.0–1.2)
CO2: 28 mmol/L (ref 20–29)
Calcium: 9.3 mg/dL (ref 8.7–10.3)
Chloride: 101 mmol/L (ref 96–106)
Creatinine, Ser: 0.85 mg/dL (ref 0.57–1.00)
Globulin, Total: 3.1 g/dL (ref 1.5–4.5)
Glucose: 90 mg/dL (ref 65–99)
Potassium: 3.6 mmol/L (ref 3.5–5.2)
Sodium: 141 mmol/L (ref 134–144)
Total Protein: 7.2 g/dL (ref 6.0–8.5)
eGFR: 76 mL/min/{1.73_m2} (ref >59)

## 2021-03-27 LAB — LIPID PANEL
Chol/HDL Ratio: 3.7 ratio (ref 0.0–4.4)
Cholesterol, Total: 144 mg/dL (ref 100–199)
HDL: 39 mg/dL — ABNORMAL LOW (ref >39)
LDL Chol Calc (NIH): 84 mg/dL (ref 0–99)
Triglycerides: 112 mg/dL (ref 0–149)
VLDL Cholesterol Cal: 21 mg/dL (ref 5–40)

## 2021-03-28 NOTE — Progress Notes (Signed)
Established Patient Office Visit  Subjective:  Patient ID: Chelsea Walters, female    DOB: 1956-02-28  Age: 65 y.o. MRN: [id number]  CC:  Chief Complaint  Patient presents with   Blood Pressure Check    HPI ESMAE DONATHAN presents for blood pressure evaluation since being compliant with medication regimen amlodipine 10 mg and hydrochlorothiazide 25 mg daily blood pressure is at goal less than or equal to 130/80.  She denies shortness of breath, headaches, chest pain or lower extremity edema, sudden onset, vision changes, unilateral weakness, dizziness, paresthesias.  Actually feeling better  Past Medical History:  Diagnosis Date   Depression    High cholesterol    Hypertension     Past Surgical History:  Procedure Laterality Date   HAND SURGERY Left     No family history on file.  Social History   Socioeconomic History   Marital status: Married    Spouse name: Not on file   Number of children: Not on file   Years of education: Not on file   Highest education level: Not on file  Occupational History   Not on file  Tobacco Use   Smoking status: Never   Smokeless tobacco: Never  Substance and Sexual Activity   Alcohol use: Never   Drug use: Never   Sexual activity: Not Currently  Other Topics Concern   Not on file  Social History Narrative   Not on file   Social Determinants of Health   Financial Resource Strain: Not on file  Food Insecurity: Not on file  Transportation Needs: Not on file  Physical Activity: Not on file  Stress: Not on file  Social Connections: Not on file  Intimate Partner Violence: Not on file    Outpatient Medications Prior to Visit  Medication Sig Dispense Refill   albuterol (VENTOLIN HFA) 108 (90 Base) MCG/ACT inhaler Inhale 1-2 puffs into the lungs every 6 (six) hours as needed for wheezing or shortness of breath. 1 each 0   amLODipine (NORVASC) 10 MG tablet Take 1 tablet (10 mg total) by mouth daily. 90 tablet 1    amoxicillin-clavulanate (AUGMENTIN) 875-125 MG tablet Take 1 tablet by mouth every 12 (twelve) hours. 14 tablet 0   atorvastatin (LIPITOR) 10 MG tablet Take 1 tablet (10 mg total) by mouth daily. 90 tablet 1   buPROPion (WELLBUTRIN) 75 MG tablet Take 1 tablet (75 mg total) by mouth daily. 90 tablet 1   hydrochlorothiazide (HYDRODIURIL) 25 MG tablet Take 1 tablet (25 mg total) by mouth daily. 90 tablet 1   meloxicam (MOBIC) 7.5 MG tablet Take 1 tablet (7.5 mg total) by mouth 2 (two) times daily as needed for pain. 30 tablet 2   sertraline (ZOLOFT) 100 MG tablet Take 1 tablet (100 mg total) by mouth daily. 90 tablet 1   No facility-administered medications prior to visit.    No Known Allergies  ROS Review of Systems  All other systems reviewed and are negative.    Objective:    Physical Exam Vitals reviewed.  Constitutional:      Appearance: She is obese.  HENT:     Head: Normocephalic.     Right Ear: Tympanic membrane and external ear normal.     Left Ear: Tympanic membrane and external ear normal.     Nose: Nose normal.  Eyes:     Extraocular Movements: Extraocular movements intact.  Cardiovascular:     Rate and Rhythm: Normal rate and regular rhythm.  Pulmonary:  Effort: Pulmonary effort is normal.     Breath sounds: Normal breath sounds.  Abdominal:     General: Bowel sounds are normal. There is distension.     Palpations: Abdomen is soft.  Musculoskeletal:        General: Normal range of motion.     Cervical back: Normal range of motion and neck supple.  Skin:    General: Skin is warm and dry.  Neurological:     Mental Status: She is alert and oriented to person, place, and time.  Psychiatric:        Mood and Affect: Mood normal.        Behavior: Behavior normal.        Thought Content: Thought content normal.        Judgment: Judgment normal.   BP 125/81 (BP Location: Right Arm, Patient Position: Sitting, Cuff Size: Large)   Pulse 70   Temp (!) 97.3 F  (36.3 C) (Temporal)   Ht 5' 3"  (1.6 m)   Wt 190 lb 3.2 oz (86.3 kg)   SpO2 98%   BMI 33.69 kg/m  Wt Readings from Last 3 Encounters:  03/26/21 190 lb 3.2 oz (86.3 kg)  12/24/20 200 lb 3.2 oz (90.8 kg)  12/02/20 202 lb 6.4 oz (91.8 kg)     Health Maintenance Due  Topic Date Due   HIV Screening  Never done   PAP SMEAR-Modifier  Never done   COVID-19 Vaccine (4 - Booster for Pfizer series) 12/08/2020    There are no preventive care reminders to display for this patient.  No results found for: TSH Lab Results  Component Value Date   WBC 5.9 03/26/2021   HGB 13.2 03/26/2021   HCT 39.8 03/26/2021   MCV 88 03/26/2021   PLT 341 03/26/2021   Lab Results  Component Value Date   NA 141 03/26/2021   K 3.6 03/26/2021   CO2 28 03/26/2021   GLUCOSE 90 03/26/2021   BUN 14 03/26/2021   CREATININE 0.85 03/26/2021   BILITOT 0.6 03/26/2021   ALKPHOS 48 03/26/2021   AST 17 03/26/2021   ALT 21 03/26/2021   PROT 7.2 03/26/2021   ALBUMIN 4.1 03/26/2021   CALCIUM 9.3 03/26/2021   EGFR 76 03/26/2021   Lab Results  Component Value Date   CHOL 144 03/26/2021   Lab Results  Component Value Date   HDL 39 (L) 03/26/2021   Lab Results  Component Value Date   LDLCALC 84 03/26/2021   Lab Results  Component Value Date   TRIG 112 03/26/2021   Lab Results  Component Value Date   CHOLHDL 3.7 03/26/2021   No results found for: HGBA1C    Assessment & Plan:  Catha was seen today for blood pressure check.  Diagnoses and all orders for this visit:  Essential hypertension Blood pressure is at goal.  goal of less than 130/80, low-sodium, DASH diet, medication compliance, 150 minutes of moderate intensity exercise per week. Discussed medication compliance, adverse effects.  -     CMP14+EGFR  Class 2 obesity due to excess calories without serious comorbidity with body mass index (BMI) of 36.0 to 36.9 in adult Obesity is 30-39 indicating an excess in caloric intake or underlining  conditions. This may lead to other co-morbidities. Lifestyle modifications of diet and exercise may reduce obesity.   -     Lipid Panel -     CMP14+EGFR -     CBC with Differential  Hyperlipidemia, unspecified hyperlipidemia type  Healthy lifestyle diet of fruits vegetables fish nuts whole grains and low saturated fat . Foods high in cholesterol or liver, fatty meats,cheese, butter avocados, nuts and seeds, chocolate and fried foods.   -     Lipid Panel  Need for shingles vaccine -     Cancel: Varicella-zoster vaccine subcutaneous -     Varicella-zoster vaccine IM (Shingrix)     Follow-up: Return for schedule pap.    Kerin Perna, NP

## 2021-04-08 ENCOUNTER — Ambulatory Visit (INDEPENDENT_AMBULATORY_CARE_PROVIDER_SITE_OTHER): Payer: 59 | Admitting: Primary Care

## 2021-04-08 ENCOUNTER — Other Ambulatory Visit (HOSPITAL_COMMUNITY)
Admission: RE | Admit: 2021-04-08 | Discharge: 2021-04-08 | Disposition: A | Discharge status: Home / Self Care | Payer: 59 | Source: Ambulatory Visit | Attending: Primary Care | Admitting: Primary Care

## 2021-04-08 ENCOUNTER — Other Ambulatory Visit: Payer: Self-pay

## 2021-04-08 ENCOUNTER — Encounter (INDEPENDENT_AMBULATORY_CARE_PROVIDER_SITE_OTHER): Payer: Self-pay | Admitting: Primary Care

## 2021-04-08 DIAGNOSIS — Z124 Encounter for screening for malignant neoplasm of cervix: Secondary | ICD-10-CM | POA: Diagnosis not present

## 2021-04-08 NOTE — Patient Instructions (Signed)
Pap Test Why am I having this test? A Pap test, also called a Pap smear, is a screening test to check for signs of: Cancer of the vagina, cervix, and uterus. The cervix is the lower part of the uterus that opens into the vagina. Infection. Changes that may be a sign that cancer is developing (precancerous changes). Women need this test on a regular basis. In general, you should have a Pap test every 3 years until you reach menopause or age 65. Women aged 30-60 may choose to have their Pap test done at the same time as an HPV (human papillomavirus) test every 5 years (instead of every 3 years). Your health care provider may recommend having Pap tests more or less oftendepending on your medical conditions and past Pap test results. What kind of sample is taken?  Your health care provider will collect a sample of cells from the surface of your cervix. This will be done using a small cotton swab, plastic spatula, or brush. This sample is often collected during a pelvic exam, when you are lying on your back on an exam table with feet in footrests (stirrups). In some cases, fluids (secretions) from the cervix or vagina may also be collected. How do I prepare for this test? Be aware of where you are in your menstrual cycle. If you are menstruating on the day of the test, you may be asked to reschedule. You may need to reschedule if you have a known vaginal infection on the day of the test. Follow instructions from your health care provider about: Changing or stopping your regular medicines. Some medicines can cause abnormal test results, such as digitalis and tetracycline. Avoiding douching or taking a bath the day before or the day of the test. Tell a health care provider about: Any allergies you have. All medicines you are taking, including vitamins, herbs, eye drops, creams, and over-the-counter medicines. Any blood disorders you have. Any surgeries you have had. Any medical conditions you  have. Whether you are pregnant or may be pregnant. How are the results reported? Your test results will be reported as either abnormal or normal. A false-positive result can occur. A false positive is incorrect because itmeans that a condition is present when it is not. A false-negative result can occur. A false negative is incorrect because itmeans that a condition is not present when it is. What do the results mean? A normal test result means that you do not have signs of cancer of the vagina,cervix, or uterus. An abnormal result may mean that you have: Cancer. A Pap test by itself is not enough to diagnose cancer. You will have more tests done in this case. Precancerous changes in your vagina, cervix, or uterus. Inflammation of the cervix. An STD (sexually transmitted disease). A fungal infection. A parasite infection. Talk with your health care provider about what your results mean. Questions to ask your health care provider Ask your health care provider, or the department that is doing the test: When will my results be ready? How will I get my results? What are my treatment options? What other tests do I need? What are my next steps? Summary In general, women should have a Pap test every 3 years until they reach menopause or age 65. Your health care provider will collect a sample of cells from the surface of your cervix. This will be done using a small cotton swab, plastic spatula, or brush. In some cases, fluids (secretions) from the cervix or   vagina may also be collected. This information is not intended to replace advice given to you by your health care provider. Make sure you discuss any questions you have with your healthcare provider. Document Revised: 08/11/2020 Document Reviewed: 05/01/2020 Elsevier Patient Education  2022 Elsevier Inc.  

## 2021-04-11 NOTE — Progress Notes (Signed)
  [facility]  WELL-WOMAN PHYSICAL & PAP Patient name: Chelsea Walters MRN [id number]  Date of birth: 05-05-1956 Chief Complaint:   No chief complaint on file.  History of Present Illness:   Chelsea Walters is a 65 y.o. No obstetric history on file. female being seen today for a routine well-woman exam.   CC:@visit  info@   The current method of family planning is none.  No LMP recorded. Patient is postmenopausal. Last pap unknown . Results were:  Last mammogram: 2/21. Results were: normal. Family h/o breast cancer: No Last colonoscopy: 05/29/20. Results were: normal. Family h/o colorectal cancer: No  Review of Systems:    Denies any headaches, blurred vision, fatigue, shortness of breath, chest pain, abdominal pain, abnormal vaginal discharge/itching/odor/irritation, problems with periods, bowel movements, urination, or intercourse unless otherwise stated above.  Pertinent History Reviewed:   Reviewed past medical,surgical, social and family history.  Reviewed problem list, medications and allergies.  Physical Assessment:  There were no vitals filed for this visit.There is no height or weight on file to calculate BMI.        Physical Examination:  General appearance - well appearing, and in no distress Mental status - alert, oriented to person, place, and time Psych:  She has a normal mood and affect Skin - warm and dry, normal color, no suspicious lesions noted Chest - effort normal, all lung fields clear to auscultation bilaterally Heart - normal rate and regular rhythm Neck:  midline trachea, no thyromegaly or nodules Breasts - breasts appear normal, no suspicious masses, no skin or nipple changes or axillary nodes Educated patient on proper self breast examination and had patient to demonstrate SBE. Abdomen - soft, nontender, nondistended, no masses or organomegaly Pelvic-VULVA: normal appearing vulva with no masses, tenderness or lesions   VAGINA: normal  appearing vagina with normal color and discharge, no lesions   CERVIX: normal appearing cervix without discharge or lesions, no CMT UTERUS: uterus is felt to be normal size, shape, consistency and nontender  ADNEXA: No adnexal masses or tenderness noted. Extremities:  No swelling or varicosities noted  No results found for this or any previous visit (from the past 24 hour(s)).   Assessment & Plan:   1) Well-Woman Exam with Pap Cervical cancer screening -     Cytology - PAP([place])     Labs/procedures today:   Mammogram 07/25/20 or sooner if problems Colonoscopy 05/29/20 or sooner if problems  No orders of the defined types were placed in this encounter.   Meds: No orders of the defined types were placed in this encounter.   Follow-up: Return in about 3 months (around 07/09/2021) for Bp .  This note has been created with Education officer, environmental. Any transcriptional errors are unintentional.   Grayce Sessions, NP 04/11/2021, 11:02 PM

## 2021-04-12 LAB — CYTOLOGY - PAP
Chlamydia: NEGATIVE
Comment: NEGATIVE
Comment: NEGATIVE
Comment: NEGATIVE
Comment: NORMAL
Diagnosis: NEGATIVE
High risk HPV: NEGATIVE
Neisseria Gonorrhea: NEGATIVE
Trichomonas: NEGATIVE

## 2021-04-26 ENCOUNTER — Other Ambulatory Visit (INDEPENDENT_AMBULATORY_CARE_PROVIDER_SITE_OTHER): Payer: Self-pay | Admitting: Primary Care

## 2021-04-26 DIAGNOSIS — F32A Depression, unspecified: Secondary | ICD-10-CM

## 2021-04-26 DIAGNOSIS — Z760 Encounter for issue of repeat prescription: Secondary | ICD-10-CM

## 2021-04-28 ENCOUNTER — Other Ambulatory Visit (INDEPENDENT_AMBULATORY_CARE_PROVIDER_SITE_OTHER): Payer: Self-pay | Admitting: Primary Care

## 2021-04-28 DIAGNOSIS — Z760 Encounter for issue of repeat prescription: Secondary | ICD-10-CM

## 2021-04-28 DIAGNOSIS — F32A Depression, unspecified: Secondary | ICD-10-CM

## 2021-04-28 NOTE — Telephone Encounter (Signed)
Notes to clinic:  ZERO refills remain on this prescription. Your patient is requesting advance approval of refills for this medication to PREVENT ANY MISSED DOSES   Requested Prescriptions  Pending Prescriptions Disp Refills   buPROPion (WELLBUTRIN) 75 MG tablet [Pharmacy Med Name: BUPROPION 75MG  TABLETS] 90 tablet 0    Sig: TAKE 1 TABLET(75 MG) BY MOUTH DAILY     Psychiatry: Antidepressants - bupropion Passed - 04/28/2021  8:05 AM      Passed - Last BP in normal range    BP Readings from Last 1 Encounters:  03/26/21 125/81          Passed - Valid encounter within last 6 months    Recent Outpatient Visits           2 weeks ago Cervical cancer screening   [facility], NP   1 month ago Essential hypertension   [facility] Grayce Sessions, NP   4 months ago Essential hypertension   [facility] Grayce Sessions, NP   4 months ago Essential hypertension   [facility] Grayce Sessions P, NP   5 months ago Encounter to establish care   [facility] P, NP               sertraline (ZOLOFT) 100 MG tablet [Pharmacy Med Name: SERTRALINE 100MG  TABLETS] 90 tablet 0    Sig: TAKE 1 TABLET(100 MG) BY MOUTH DAILY     Psychiatry:  Antidepressants - SSRI Passed - 04/28/2021  8:05 AM      Passed - Valid encounter within last 6 months    Recent Outpatient Visits           2 weeks ago Cervical cancer screening   [facility] 04/30/2021, NP   1 month ago Essential hypertension   [facility], NP   4 months ago Essential hypertension   [facility] Grayce Sessions, NP   4 months ago Essential hypertension   [facility] Grayce Sessions, NP   5 months ago Encounter to establish care   [facility] Grayce Sessions P, NP               atorvastatin (LIPITOR) 10 MG tablet [Pharmacy Med Name: ATORVASTATIN 10MG  TABLETS] 90 tablet 0    Sig: TAKE 1 TABLET(10 MG) BY MOUTH DAILY     Cardiovascular:  Antilipid - Statins Failed - 04/28/2021  8:05 AM      Failed - HDL in normal range and within 360 days    HDL  Date Value Ref Range Status  03/26/2021 39 (L) >39 mg/dL Final          Passed - Total Cholesterol in normal range and within 360 days    Cholesterol, Total  Date Value Ref Range Status  03/26/2021 144 100 - 199 mg/dL Final          Passed - LDL in normal range and within 360 days    LDL Chol Calc (NIH)  Date Value Ref Range Status  03/26/2021 84 0 - 99 mg/dL Final          Passed - Triglycerides in normal range and within 360 days    Triglycerides  Date Value Ref Range Status  03/26/2021 112 0 - 149  mg/dL Final          Passed - Patient is not pregnant      Passed - Valid encounter within last 12 months    Recent Outpatient Visits           2 weeks ago Cervical cancer screening   [facility] Grayce Sessions, NP   1 month ago Essential hypertension   [facility] Grayce Sessions, NP   4 months ago Essential hypertension   [facility] Grayce Sessions, NP   4 months ago Essential hypertension   [facility] Grayce Sessions, NP   5 months ago Encounter to establish care   [facility] Grayce Sessions, NP

## 2021-05-09 ENCOUNTER — Other Ambulatory Visit: Payer: Self-pay

## 2021-05-09 ENCOUNTER — Ambulatory Visit
Admission: RE | Admit: 2021-05-09 | Discharge: 2021-05-09 | Disposition: A | Discharge status: Home / Self Care | Payer: 59 | Source: Ambulatory Visit | Attending: Internal Medicine | Admitting: Internal Medicine

## 2021-05-09 VITALS — BP 132/81 | HR 69 | Temp 98.0°F | Resp 18

## 2021-05-09 DIAGNOSIS — J029 Acute pharyngitis, unspecified: Secondary | ICD-10-CM | POA: Diagnosis not present

## 2021-05-09 DIAGNOSIS — J069 Acute upper respiratory infection, unspecified: Secondary | ICD-10-CM | POA: Insufficient documentation

## 2021-05-09 LAB — POCT RAPID STREP A (OFFICE): Rapid Strep A Screen: NEGATIVE

## 2021-05-09 MED ORDER — CETIRIZINE HCL 10 MG PO TABS: 10.0000 mg | ORAL_TABLET | Freq: Every day | ORAL | 0 refills | Status: DC

## 2021-05-09 MED ORDER — GUAIFENESIN 200 MG PO TABS: 200.0000 mg | ORAL_TABLET | ORAL | 0 refills | Status: DC | PRN

## 2021-05-09 MED ORDER — FLUTICASONE PROPIONATE 50 MCG/ACT NA SUSP
1.0000 | Freq: Every day | NASAL | 0 refills | Status: DC
Start: 2021-05-09 — End: 2021-12-07

## 2021-05-09 NOTE — ED Provider Notes (Signed)
[facility]    CSN: [id number] Arrival date & time: 05/09/21  1149      History   Chief Complaint Chief Complaint  Patient presents with   appointment @12p    Cough   Nasal Congestion    HPI Chelsea Walters is a 65 y.o. female.   Patient presents with cough, nasal congestion, sore throat that has been present since yesterday.  Denies any abdominal pain, nausea, vomiting, diarrhea.  Denies any known fevers.  Grandchild has similar symptoms.  Denies any chest pain or shortness of breath.  Has been taking OTC cough and cold medication that she can not remember the name of with minimal improvement of symptoms.    Cough  Past Medical History:  Diagnosis Date   Depression    High cholesterol    Hypertension     There are no problems to display for this patient.   Past Surgical History:  Procedure Laterality Date   HAND SURGERY Left     OB History   No obstetric history on file.      Home Medications    Prior to Admission medications   Medication Sig Start Date End Date Taking? Authorizing Provider  cetirizine (ZYRTEC) 10 MG tablet Take 1 tablet (10 mg total) by mouth daily. 05/09/21  Yes 05/11/21, FNP  fluticasone (FLONASE) 50 MCG/ACT nasal spray Place 1 spray into both nostrils daily. 05/09/21  Yes 05/11/21, FNP  guaiFENesin 200 MG tablet Take 1 tablet (200 mg total) by mouth every 4 (four) hours as needed for cough or to loosen phlegm. 05/09/21  Yes 05/11/21, FNP  albuterol (VENTOLIN HFA) 108 (90 Base) MCG/ACT inhaler Inhale 1-2 puffs into the lungs every 6 (six) hours as needed for wheezing or shortness of breath. 02/22/21   02/24/21, PA-C  amLODipine (NORVASC) 10 MG tablet Take 1 tablet (10 mg total) by mouth daily. 12/02/20   12/04/20, NP  amoxicillin-clavulanate (AUGMENTIN) 875-125 MG tablet Take 1 tablet by mouth every 12 (twelve) hours. 02/22/21   02/24/21, PA-C  atorvastatin (LIPITOR) 10 MG tablet TAKE 1  TABLET(10 MG) BY MOUTH DAILY 04/26/21   04/28/21, NP  buPROPion (WELLBUTRIN) 75 MG tablet TAKE 1 TABLET(75 MG) BY MOUTH DAILY 04/26/21   04/28/21, NP  hydrochlorothiazide (HYDRODIURIL) 25 MG tablet Take 1 tablet (25 mg total) by mouth daily. 12/02/20   12/04/20, NP  meloxicam (MOBIC) 7.5 MG tablet Take 1 tablet (7.5 mg total) by mouth 2 (two) times daily as needed for pain. 11/12/20   01/12/21, MD  sertraline (ZOLOFT) 100 MG tablet TAKE 1 TABLET(100 MG) BY MOUTH DAILY 04/26/21   04/28/21, NP    Family History History reviewed. No pertinent family history.  Social History Social History   Tobacco Use   Smoking status: Never   Smokeless tobacco: Never  Substance Use Topics   Alcohol use: Never   Drug use: Never     Allergies   Patient has no known allergies.   Review of Systems Review of Systems  Respiratory:  Positive for cough.   Per HPI  Physical Exam Triage Vital Signs ED Triage Vitals  Enc Vitals Group     BP 05/09/21 1212 132/81     Pulse Rate 05/09/21 1212 69     Resp 05/09/21 1212 18     Temp 05/09/21 1212 98 F (36.7 C)     Temp  Source 05/09/21 1212 Oral     SpO2 05/09/21 1212 98 %     Weight --      Height --      Head Circumference --      Peak Flow --      Pain Score 05/09/21 1214 0     Pain Loc --      Pain Edu? --      Excl. in GC? --    No data found.  Updated Vital Signs BP 132/81 (BP Location: Left Arm)   Pulse 69   Temp 98 F (36.7 C) (Oral)   Resp 18   SpO2 98%   Visual Acuity Right Eye Distance:   Left Eye Distance:   Bilateral Distance:    Right Eye Near:   Left Eye Near:    Bilateral Near:     Physical Exam Constitutional:      General: She is not in acute distress.    Appearance: Normal appearance.  HENT:     Head: Normocephalic and atraumatic.     Right Ear: Ear canal normal. A middle ear effusion is present.     Left Ear: Ear canal normal. A middle ear effusion is present.      Nose: Congestion present.     Mouth/Throat:     Mouth: Mucous membranes are moist.     Pharynx: Posterior oropharyngeal erythema present.  Eyes:     Extraocular Movements: Extraocular movements intact.     Conjunctiva/sclera: Conjunctivae normal.     Pupils: Pupils are equal, round, and reactive to light.  Cardiovascular:     Rate and Rhythm: Normal rate and regular rhythm.     Pulses: Normal pulses.     Heart sounds: Normal heart sounds.  Pulmonary:     Effort: Pulmonary effort is normal. No respiratory distress.     Breath sounds: Normal breath sounds. No wheezing.  Abdominal:     General: Abdomen is flat. Bowel sounds are normal.     Palpations: Abdomen is soft.  Musculoskeletal:        General: Normal range of motion.     Cervical back: Normal range of motion.  Skin:    General: Skin is warm and dry.  Neurological:     General: No focal deficit present.     Mental Status: She is alert and oriented to person, place, and time. Mental status is at baseline.  Psychiatric:        Mood and Affect: Mood normal.        Behavior: Behavior normal.     UC Treatments / Results  Labs (all labs ordered are listed, but only abnormal results are displayed) Labs Reviewed  CULTURE, GROUP A STREP ([facility])  NOVEL CORONAVIRUS, NAA  POCT RAPID STREP A (OFFICE)    EKG   Radiology No results found.  Procedures Procedures (including critical care time)  Medications Ordered in UC Medications - No data to display  Initial Impression / Assessment and Plan / UC Course  I have reviewed the triage vital signs and the nursing notes.  Pertinent labs & imaging results that were available during my care of the patient were reviewed by me and considered in my medical decision making (see chart for details).     Patient presents with symptoms likely from a viral upper respiratory infection. Differential includes bacterial pneumonia, sinusitis, allergic rhinitis, Covid 19. Do not suspect  underlying cardiopulmonary process. Symptoms seem unlikely related to ACS, CHF or COPD exacerbations, pneumonia,  pneumothorax. Patient is nontoxic appearing and not in need of emergent medical intervention.  Recommended symptom control with over the counter medications: Daily oral anti-histamine, Oral decongestant or IN corticosteroid, saline irrigations, cepacol lozenges, Robitussin, Delsym, honey tea.  Rapid strep test was negative. Throat culture and covid 19 viral swab are pending.   Return if symptoms fail to improve in 1-2 weeks or you develop shortness of breath, chest pain, severe headache. Patient states understanding and is agreeable.  Discharged with PCP followup.  Final Clinical Impressions(s) / UC Diagnoses   Final diagnoses:  Viral upper respiratory infection  Sore throat     Discharge Instructions      You likely having a viral upper respiratory infection. We recommended symptom control. I expect your symptoms to start improving in the next 1-2 weeks.   1. Take the prescribed cetirizine and flonase  2. For congestion you may try an oral decongestant like coricidin HBP. You may also try intranasal flonase nasal spray or saline irrigations (neti pot, sinus cleanse). Avoid medications that end in D or DM as they may raise your blood pressure.  3. For your sore throat you may try cepacol lozenges, salt water gargles, throat spray. Treatment of congestion may also help your sore throat.  4. For cough, you have been prescribed a medication.  5. Take Tylenol or Ibuprofen to help with pain/inflammation  6. Stay hydrated, drink plenty of fluids to keep throat coated and less irritated  Honey Tea For cough/sore throat try using a honey-based tea. Use 3 teaspoons of honey with juice squeezed from half lemon. Place shaved pieces of ginger into 1/2-1 cup of water and warm over stove top. Then mix the ingredients and repeat every 4 hours as needed.   Your rapid strep test was  negative. Throat culture and covid 19 viral swabs are pending. We will call if these are positive.      ED Prescriptions     Medication Sig Dispense Auth. Provider   cetirizine (ZYRTEC) 10 MG tablet Take 1 tablet (10 mg total) by mouth daily. 30 tablet Lance Muss, FNP   fluticasone (FLONASE) 50 MCG/ACT nasal spray Place 1 spray into both nostrils daily. 16 g Lance Muss, FNP   guaiFENesin 200 MG tablet Take 1 tablet (200 mg total) by mouth every 4 (four) hours as needed for cough or to loosen phlegm. 30 suppository Lance Muss, FNP      PDMP not reviewed this encounter.   Lance Muss, FNP 05/09/21 1251

## 2021-05-09 NOTE — ED Triage Notes (Signed)
Onset yesterday of productive cough, congestion, sore throat and intermittent HA. Denies abdominal pain, n/v/d. Has been taking OTC cough meds without decrease in sxs. Pt is covid vaccinated and boosted. Grandchild is sick.

## 2021-05-09 NOTE — Discharge Instructions (Addendum)
You likely having a viral upper respiratory infection. We recommended symptom control. I expect your symptoms to start improving in the next 1-2 weeks.   1. Take the prescribed cetirizine and flonase  2. For congestion you may try an oral decongestant like coricidin HBP. You may also try intranasal flonase nasal spray or saline irrigations (neti pot, sinus cleanse). Avoid medications that end in D or DM as they may raise your blood pressure.  3. For your sore throat you may try cepacol lozenges, salt water gargles, throat spray. Treatment of congestion may also help your sore throat.  4. For cough, you have been prescribed a medication.  5. Take Tylenol or Ibuprofen to help with pain/inflammation  6. Stay hydrated, drink plenty of fluids to keep throat coated and less irritated  Honey Tea For cough/sore throat try using a honey-based tea. Use 3 teaspoons of honey with juice squeezed from half lemon. Place shaved pieces of ginger into 1/2-1 cup of water and warm over stove top. Then mix the ingredients and repeat every 4 hours as needed.   Your rapid strep test was negative. Throat culture and covid 19 viral swabs are pending. We will call if these are positive.

## 2021-05-10 LAB — NOVEL CORONAVIRUS, NAA: SARS-CoV-2, NAA: NOT DETECTED

## 2021-05-10 LAB — SARS-COV-2, NAA 2 DAY TAT

## 2021-05-12 LAB — CULTURE, GROUP A STREP (THRC)

## 2021-05-31 ENCOUNTER — Other Ambulatory Visit (INDEPENDENT_AMBULATORY_CARE_PROVIDER_SITE_OTHER): Payer: Self-pay | Admitting: Primary Care

## 2021-05-31 NOTE — Telephone Encounter (Signed)
Sent to PCP ?

## 2021-08-16 ENCOUNTER — Other Ambulatory Visit (INDEPENDENT_AMBULATORY_CARE_PROVIDER_SITE_OTHER): Payer: Self-pay | Admitting: Primary Care

## 2021-08-16 ENCOUNTER — Telehealth (INDEPENDENT_AMBULATORY_CARE_PROVIDER_SITE_OTHER): Payer: Self-pay

## 2021-08-16 DIAGNOSIS — F32A Depression, unspecified: Secondary | ICD-10-CM

## 2021-08-16 DIAGNOSIS — Z760 Encounter for issue of repeat prescription: Secondary | ICD-10-CM

## 2021-08-16 MED ORDER — ATORVASTATIN CALCIUM 10 MG PO TABS: ORAL_TABLET | ORAL | 0 refills | Status: DC

## 2021-08-16 MED ORDER — BUPROPION HCL 75 MG PO TABS: ORAL_TABLET | ORAL | 0 refills | Status: DC

## 2021-08-16 MED ORDER — SERTRALINE HCL 100 MG PO TABS: ORAL_TABLET | ORAL | 0 refills | Status: DC

## 2021-08-16 NOTE — Telephone Encounter (Signed)
Copied from CRM [id number]. Topic: General - Other >> Aug 16, 2021 11:54 AM Chelsea Walters wrote: Reason for CRM: The patient has called regarding the prior authorization of their  atorvastatin (LIPITOR) 10 MG tablet [[id number]]  sertraline (ZOLOFT) 100 MG tablet [[id number]]  buPROPion (WELLBUTRIN) 75 MG tablet [[id number]]   The patient would like to be contacted by Walters member of staff when these medications are authorized  Please contact further if needed

## 2021-08-16 NOTE — Telephone Encounter (Signed)
Sent to PCP ?

## 2021-08-31 ENCOUNTER — Other Ambulatory Visit (INDEPENDENT_AMBULATORY_CARE_PROVIDER_SITE_OTHER): Payer: Self-pay | Admitting: Primary Care

## 2021-08-31 NOTE — Telephone Encounter (Signed)
Sent to PCP ?

## 2021-09-01 ENCOUNTER — Other Ambulatory Visit (INDEPENDENT_AMBULATORY_CARE_PROVIDER_SITE_OTHER): Payer: Self-pay | Admitting: Primary Care

## 2021-09-01 DIAGNOSIS — F32A Depression, unspecified: Secondary | ICD-10-CM

## 2021-09-01 DIAGNOSIS — Z760 Encounter for issue of repeat prescription: Secondary | ICD-10-CM

## 2021-09-01 MED ORDER — BUPROPION HCL 75 MG PO TABS: ORAL_TABLET | ORAL | 1 refills | Status: DC

## 2021-11-05 ENCOUNTER — Other Ambulatory Visit (INDEPENDENT_AMBULATORY_CARE_PROVIDER_SITE_OTHER): Payer: Self-pay | Admitting: Primary Care

## 2021-11-05 DIAGNOSIS — Z760 Encounter for issue of repeat prescription: Secondary | ICD-10-CM

## 2021-11-05 DIAGNOSIS — F32A Depression, unspecified: Secondary | ICD-10-CM

## 2021-11-05 NOTE — Telephone Encounter (Signed)
Sent to PCP ?

## 2021-11-08 MED ORDER — SERTRALINE HCL 100 MG PO TABS: ORAL_TABLET | ORAL | 0 refills | Status: DC

## 2021-11-08 MED ORDER — ATORVASTATIN CALCIUM 10 MG PO TABS: ORAL_TABLET | ORAL | 0 refills | Status: DC

## 2021-12-07 ENCOUNTER — Ambulatory Visit (INDEPENDENT_AMBULATORY_CARE_PROVIDER_SITE_OTHER): Payer: 59 | Admitting: Primary Care

## 2021-12-07 ENCOUNTER — Encounter (INDEPENDENT_AMBULATORY_CARE_PROVIDER_SITE_OTHER): Payer: Self-pay | Admitting: Primary Care

## 2021-12-07 ENCOUNTER — Other Ambulatory Visit: Payer: Self-pay

## 2021-12-07 VITALS — BP 140/85 | HR 68 | Temp 97.7°F | Ht 63.0 in | Wt 182.6 lb

## 2021-12-07 DIAGNOSIS — Z131 Encounter for screening for diabetes mellitus: Secondary | ICD-10-CM | POA: Diagnosis not present

## 2021-12-07 DIAGNOSIS — N952 Postmenopausal atrophic vaginitis: Secondary | ICD-10-CM

## 2021-12-07 DIAGNOSIS — F32A Depression, unspecified: Secondary | ICD-10-CM

## 2021-12-07 DIAGNOSIS — Z23 Encounter for immunization: Secondary | ICD-10-CM | POA: Diagnosis not present

## 2021-12-07 DIAGNOSIS — Z0001 Encounter for general adult medical examination with abnormal findings: Secondary | ICD-10-CM | POA: Diagnosis not present

## 2021-12-07 DIAGNOSIS — Z760 Encounter for issue of repeat prescription: Secondary | ICD-10-CM

## 2021-12-07 DIAGNOSIS — Z0000 Encounter for general adult medical examination without abnormal findings: Secondary | ICD-10-CM

## 2021-12-07 DIAGNOSIS — Z780 Asymptomatic menopausal state: Secondary | ICD-10-CM

## 2021-12-07 LAB — POCT GLYCOSYLATED HEMOGLOBIN (HGB A1C): Hemoglobin A1C: 5.5 % (ref 4.0–5.6)

## 2021-12-07 MED ORDER — BUPROPION HCL 75 MG PO TABS: ORAL_TABLET | ORAL | 1 refills | Status: DC

## 2021-12-07 MED ORDER — AMLODIPINE BESYLATE 10 MG PO TABS: 10.0000 mg | ORAL_TABLET | Freq: Every day | ORAL | 1 refills | Status: DC

## 2021-12-07 MED ORDER — SERTRALINE HCL 100 MG PO TABS: ORAL_TABLET | ORAL | 1 refills | Status: DC

## 2021-12-07 MED ORDER — HYDROCHLOROTHIAZIDE 25 MG PO TABS: ORAL_TABLET | ORAL | 1 refills | Status: DC

## 2021-12-07 NOTE — Patient Instructions (Addendum)
aginal moisturizers are intended for use routinely, typically two or three days per week, not just during sexual activity. These products are typically bioadhesives. Hyaluronic acid is often used as a key ingredient in vaginal moisturizers. Many moisturizer products are available in pharmacies and online (eg, Replens, Vagisil Moisturizer, Feminease, Moist Again, K-Y Liquibeads, Hyalo GYN, Revaree suppositories) (table 1). ??Lubricants are used only at the time of sexual activity. Lubricants may be water-based (eg, Astroglide, Slippery Stuff, K-Y Jelly), silicone-based (eg, Pjur, ID Millennium), or oil-based (Elegance Women's Lubricant, Simply Slick) oil-based lubricants cause breakdown of latex condoms. Some clinicians counsel that lubricants with glycerin (also referred to as glycerol) may result in an increased risk of vulvovaginal candidiasis, but there is no evidence to support this and some data suggest that glycerin inhibits candidal growth  ?Zoster Vaccine, Recombinant injection ?What is this medication? ?ZOSTER VACCINE (ZOS ter vak SEEN) is a vaccine used to reduce the risk of getting shingles. This vaccine is not used to treat shingles or nerve pain from shingles. ?This medicine may be used for other purposes; ask your health care provider or pharmacist if you have questions. ?COMMON BRAND NAME(S): SHINGRIX ?What should I tell my care team before I take this medication? ?They need to know if you have any of these conditions: ?cancer ?immune system problems ?an unusual or allergic reaction to Zoster vaccine, other medications, foods, dyes, or preservatives ?pregnant or trying to get pregnant ?breast-feeding ?How should I use this medication? ?This vaccine is injected into a muscle. It is given by a health care provider. ?A copy of Vaccine Information Statements will be given before each vaccination. Be sure to read this information carefully each time. This sheet may change often. ?Talk to your health care  provider about the use of this vaccine in children. This vaccine is not approved for use in children. ?Overdosage: If you think you have taken too much of this medicine contact a poison control center or emergency room at once. ?NOTE: This medicine is only for you. Do not share this medicine with others. ?What if I miss a dose? ?Keep appointments for follow-up (booster) doses. It is important not to miss your dose. Call your health care provider if you are unable to keep an appointment. ?What may interact with this medication? ?medicines that suppress your immune system ?medicines to treat cancer ?steroid medicines like prednisone or cortisone ?This list may not describe all possible interactions. Give your health care provider a list of all the medicines, herbs, non-prescription drugs, or dietary supplements you use. Also tell them if you smoke, drink alcohol, or use illegal drugs. Some items may interact with your medicine. ?What should I watch for while using this medication? ?Visit your health care provider regularly. ?This vaccine, like all vaccines, may not fully protect everyone. ?What side effects may I notice from receiving this medication? ?Side effects that you should report to your doctor or health care professional as soon as possible: ?allergic reactions (skin rash, itching or hives; swelling of the face, lips, or tongue) ?trouble breathing ?Side effects that usually do not require medical attention (report these to your doctor or health care professional if they continue or are bothersome): ?chills ?headache ?fever ?nausea ?pain, redness, or irritation at site where injected ?tiredness ?vomiting ?This list may not describe all possible side effects. Call your doctor for medical advice about side effects. You may report side effects to FDA at 1-800-FDA-1088. ?Where should I keep my medication? ?This vaccine is only given by  a health care provider. It will not be stored at home. ?NOTE: This sheet is a  summary. It may not cover all possible information. If you have questions about this medicine, talk to your doctor, pharmacist, or health care provider. ?? 2022 Elsevier/Gold Standard (2021-05-18 00:00:00) ?Influenza, Adult ?Influenza is also called "the flu." It is an infection in the lungs, nose, and throat (respiratory tract). It spreads easily from person to person (is contagious). The flu causes symptoms that are like a cold, along with high fever and body aches. ?What are the causes? ?This condition is caused by the influenza virus. You can get the virus by: ?Breathing in droplets that are in the air after a person infected with the flu coughed or sneezed. ?Touching something that has the virus on it and then touching your mouth, nose, or eyes. ?What increases the risk? ?Certain things may make you more likely to get the flu. These include: ?Not washing your hands often. ?Having close contact with many people during cold and flu season. ?Touching your mouth, eyes, or nose without first washing your hands. ?Not getting a flu shot every year. ?You may have a higher risk for the flu, and serious problems, such as a lung infection (pneumonia), if you: ?Are older than 65. ?Are pregnant. ?Have a weakened disease-fighting system (immune system) because of a disease or because you are taking certain medicines. ?Have a long-term (chronic) condition, such as: ?Heart, kidney, or lung disease. ?Diabetes. ?Asthma. ?Have a liver disorder. ?Are very overweight (morbidly obese). ?Have anemia. ?What are the signs or symptoms? ?Symptoms usually begin suddenly and last 4-14 days. They may include: ?Fever and chills. ?Headaches, body aches, or muscle aches. ?Sore throat. ?Cough. ?Runny or stuffy (congested) nose. ?Feeling discomfort in your chest. ?Not wanting to eat as much as normal. ?Feeling weak or tired. ?Feeling dizzy. ?Feeling sick to your stomach or throwing up. ?How is this treated? ?If the flu is found early, you can be  treated with antiviral medicine. This can help to reduce how bad the illness is and how long it lasts. This may be given by mouth or through an IV tube. ?Taking care of yourself at home can help your symptoms get better. Your doctor may want you to: ?Take over-the-counter medicines. ?Drink plenty of fluids. ?The flu often goes away on its own. If you have very bad symptoms or other problems, you may be treated in a hospital. ?Follow these instructions at home: ?  ?Activity ?Rest as needed. Get plenty of sleep. ?Stay home from work or school as told by your doctor. ?Do not leave home until you do not have a fever for 24 hours without taking medicine. ?Leave home only to go to your doctor. ?Eating and drinking ?Take an ORS (oral rehydration solution). This is a drink that is sold at pharmacies and stores. ?Drink enough fluid to keep your pee pale yellow. ?Drink clear fluids in small amounts as you are able. Clear fluids include: ?Water. ?Ice chips. ?Fruit juice mixed with water. ?Low-calorie sports drinks. ?Eat bland foods that are easy to digest. Eat small amounts as you are able. These foods include: ?Bananas. ?Applesauce. ?Rice. ?Lean meats. ?Toast. ?Crackers. ?Do not eat or drink: ?Fluids that have a lot of sugar or caffeine. ?Alcohol. ?Spicy or fatty foods. ?General instructions ?Take over-the-counter and prescription medicines only as told by your doctor. ?Use a cool mist humidifier to add moisture to the air in your home. This can make it easier for you  to breathe. ?When using a cool mist humidifier, clean it daily. Empty water and replace with clean water. ?Cover your mouth and nose when you cough or sneeze. ?Wash your hands with soap and water often and for at least 20 seconds. This is also important after you cough or sneeze. If you cannot use soap and water, use alcohol-based hand sanitizer. ?Keep all follow-up visits. ?How is this prevented? ? ?Get a flu shot every year. You may get the flu shot in late  summer, fall, or winter. Ask your doctor when you should get your flu shot. ?Avoid contact with people who are sick during fall and winter. This is cold and flu season. ?Contact a doctor if: ?You get new

## 2021-12-07 NOTE — Progress Notes (Signed)
?[place] ? ?Chelsea Walters, is a 66 y.o. female ? ?[id number] ? ?[id number] ? ?DOB - [birth year] ? ?Chief Complaint  ?Patient presents with  ? Medication Refill  ?  Needs all medications refilled. She is fasting   ?    ? ?Subjective:  ? ?Chelsea Walters is a 66 y.o. female here today for a follow up visit. She does complain of vaginal irritation , dryness and painful urination at times . Patient has No headache, No chest pain, No abdominal pain - No Nausea, No new weakness tingling or numbness, No Cough - shortness of breath. ? ?No problems updated. ? ?No Known Allergies ? ?Past Medical History:  ?Diagnosis Date  ? Depression   ? High cholesterol   ? Hypertension   ? ? ?Current Outpatient Medications on File Prior to Visit  ?Medication Sig Dispense Refill  ? albuterol (VENTOLIN HFA) 108 (90 Base) MCG/ACT inhaler Inhale 1-2 puffs into the lungs every 6 (six) hours as needed for wheezing or shortness of breath. 1 each 0  ? atorvastatin (LIPITOR) 10 MG tablet TAKE 1 TABLET(10 MG) BY MOUTH DAILY 30 tablet 0  ? ?No current facility-administered medications on file prior to visit.  ? ? ?Objective:  ? ?Vitals:  ? 12/07/21 1017  ?BP: 140/85  ?Pulse: 68  ?Temp: 97.7 ?F (36.5 ?C)  ?TempSrc: Oral  ?SpO2: 94%  ?Weight: 182 lb 9.6 oz (82.8 kg)  ?Height: 5' 3"  (1.6 m)  ? ? ?Exam ?General appearance : Awake, alert, not in any distress. Speech Clear. Not toxic looking ?HEENT: Atraumatic and Normocephalic, pupils equally reactive to light and accomodation ?Neck: Supple, no JVD. No cervical lymphadenopathy.  ?Chest: Good air entry bilaterally, no added sounds  ?CVS: S1 S2 regular, no murmurs.  ?Abdomen: Bowel sounds present, Non tender and not distended with no gaurding, rigidity or rebound. ?Extremities: B/L Lower Ext shows no edema, both legs are warm to touch ?Neurology: Awake alert, and oriented X 3, CN II-XII intact, Non focal ?Skin: No Rash ? ?Data Review ?Lab Results  ?Component Value Date  ? HGBA1C 5.5  12/07/2021  ? ? ?Assessment & Plan  ? ?1. Need for shingles vaccine ?- Varicella-zoster vaccine IM (Shingrix) ? ?2. Need for immunization against influenza ? ?- Flu Vaccine QUAD 9moIM (Fluarix, Fluzone & Alfiuria Quad PF) ? ?3. Screening for diabetes mellitus ?- HgB A1c 5.5 ? ?4. Depression, unspecified depression type ?Managed with medication.  ?[place] ?PHQ-9 Total Score 5  ? ?  ?  ?- buPROPion (WELLBUTRIN) 75 MG tablet; TAKE 1 TABLET(75 MG) BY MOUTH DAILY  Dispense: 90 tablet; Refill: 1 ?- sertraline (ZOLOFT) 100 MG tablet; TAKE 1 TABLET(100 MG) BY MOUTH DAILY  Dispense: 90 tablet; Refill: 1 ? ?5. Medication refill ?- buPROPion (WELLBUTRIN) 75 MG tablet; TAKE 1 TABLET(75 MG) BY MOUTH DAILY  Dispense: 90 tablet; Refill: 1 ?- sertraline (ZOLOFT) 100 MG tablet; TAKE 1 TABLET(100 MG) BY MOUTH DAILY  Dispense: 90 tablet; Refill: 1 ? ?6. Healthcare maintenance ?- Lipid Panel ?- CMP14+EGFR ?- CBC with Differential ? ?7. Menopause ?- Testosterone ? ?8. Vaginal atrophy ?S/S is related to menopause and on AVS option given for tx ? ? ? ?Patient have been counseled extensively about nutrition and exercise. Other issues discussed during this visit include: low cholesterol diet, weight control and daily exercise, foot care, annual eye examinations at Ophthalmology, importance of adherence with medications and regular follow-up. We also discussed long  term complications of uncontrolled diabetes and hypertension.  ? ?Return in about 6 months (around 06/09/2022) for Bp/ medication refill. ? ?The patient was given clear instructions to go to ER or return to medical center if symptoms don't improve, worsen or new problems develop. The patient verbalized understanding. The patient was told to call to get lab results if they haven't heard anything in the next week.  ? ?This note has been created with Automotive engineer. Any transcriptional errors are unintentional.  ? ?Kerin Perna, NP ?12/08/2021, 1:59 PM  genitourinary syndrome of menopause  ?

## 2021-12-08 LAB — CBC WITH DIFFERENTIAL/PLATELET
Basophils Absolute: 0 10*3/uL (ref 0.0–0.2)
Basos: 0 %
EOS (ABSOLUTE): 0.2 10*3/uL (ref 0.0–0.4)
Eos: 4 %
Hematocrit: 44.6 % (ref 34.0–46.6)
Hemoglobin: 14.7 g/dL (ref 11.1–15.9)
Immature Grans (Abs): 0 10*3/uL (ref 0.0–0.1)
Immature Granulocytes: 0 %
Lymphocytes Absolute: 1.9 10*3/uL (ref 0.7–3.1)
Lymphs: 35 %
MCH: 29.2 pg (ref 26.6–33.0)
MCHC: 33 g/dL (ref 31.5–35.7)
MCV: 89 fL (ref 79–97)
Monocytes Absolute: 0.4 10*3/uL (ref 0.1–0.9)
Monocytes: 7 %
Neutrophils Absolute: 3 10*3/uL (ref 1.4–7.0)
Neutrophils: 54 %
Platelets: 213 10*3/uL (ref 150–450)
RBC: 5.04 x10E6/uL (ref 3.77–5.28)
RDW: 12.4 % (ref 11.7–15.4)
WBC: 5.5 10*3/uL (ref 3.4–10.8)

## 2021-12-08 LAB — CMP14+EGFR
ALT: 15 IU/L (ref 0–32)
AST: 14 IU/L (ref 0–40)
Albumin/Globulin Ratio: 1.3 (ref 1.2–2.2)
Albumin: 4.4 g/dL (ref 3.8–4.8)
Alkaline Phosphatase: 54 IU/L (ref 44–121)
BUN/Creatinine Ratio: 16 (ref 12–28)
BUN: 13 mg/dL (ref 8–27)
Bilirubin Total: 0.5 mg/dL (ref 0.0–1.2)
CO2: 25 mmol/L (ref 20–29)
Calcium: 10.3 mg/dL (ref 8.7–10.3)
Chloride: 101 mmol/L (ref 96–106)
Creatinine, Ser: 0.8 mg/dL (ref 0.57–1.00)
Globulin, Total: 3.5 g/dL (ref 1.5–4.5)
Glucose: 81 mg/dL (ref 70–99)
Potassium: 4.4 mmol/L (ref 3.5–5.2)
Sodium: 142 mmol/L (ref 134–144)
Total Protein: 7.9 g/dL (ref 6.0–8.5)
eGFR: 82 mL/min/{1.73_m2} (ref >59)

## 2021-12-08 LAB — LIPID PANEL
Chol/HDL Ratio: 3.5 ratio (ref 0.0–4.4)
Cholesterol, Total: 150 mg/dL (ref 100–199)
HDL: 43 mg/dL (ref >39)
LDL Chol Calc (NIH): 90 mg/dL (ref 0–99)
Triglycerides: 91 mg/dL (ref 0–149)
VLDL Cholesterol Cal: 17 mg/dL (ref 5–40)

## 2021-12-08 LAB — TESTOSTERONE: Testosterone: 30 ng/dL (ref 3–67)

## 2021-12-23 ENCOUNTER — Ambulatory Visit: Payer: Self-pay | Admitting: Family Medicine

## 2021-12-24 ENCOUNTER — Ambulatory Visit (INDEPENDENT_AMBULATORY_CARE_PROVIDER_SITE_OTHER): Payer: Self-pay | Admitting: Primary Care

## 2022-02-15 ENCOUNTER — Other Ambulatory Visit (INDEPENDENT_AMBULATORY_CARE_PROVIDER_SITE_OTHER): Payer: Self-pay

## 2022-02-15 MED ORDER — HYDROCHLOROTHIAZIDE 25 MG PO TABS: ORAL_TABLET | ORAL | 4 refills | Status: DC

## 2022-04-04 ENCOUNTER — Other Ambulatory Visit (INDEPENDENT_AMBULATORY_CARE_PROVIDER_SITE_OTHER): Payer: Self-pay | Admitting: Primary Care

## 2022-04-04 MED ORDER — ATORVASTATIN CALCIUM 10 MG PO TABS: ORAL_TABLET | ORAL | 1 refills | Status: DC

## 2022-06-09 ENCOUNTER — Encounter (INDEPENDENT_AMBULATORY_CARE_PROVIDER_SITE_OTHER): Payer: Self-pay | Admitting: Primary Care

## 2022-06-09 ENCOUNTER — Ambulatory Visit (INDEPENDENT_AMBULATORY_CARE_PROVIDER_SITE_OTHER): Payer: Medicare Other | Admitting: Primary Care

## 2022-06-09 VITALS — BP 150/82 | HR 74 | Resp 16 | Wt 186.0 lb

## 2022-06-09 DIAGNOSIS — Z760 Encounter for issue of repeat prescription: Secondary | ICD-10-CM

## 2022-06-09 DIAGNOSIS — Z23 Encounter for immunization: Secondary | ICD-10-CM | POA: Diagnosis not present

## 2022-06-09 DIAGNOSIS — E041 Nontoxic single thyroid nodule: Secondary | ICD-10-CM

## 2022-06-09 DIAGNOSIS — I10 Essential (primary) hypertension: Secondary | ICD-10-CM | POA: Diagnosis not present

## 2022-06-09 DIAGNOSIS — F32A Depression, unspecified: Secondary | ICD-10-CM

## 2022-06-09 DIAGNOSIS — Z780 Asymptomatic menopausal state: Secondary | ICD-10-CM | POA: Diagnosis not present

## 2022-06-09 DIAGNOSIS — Z0000 Encounter for general adult medical examination without abnormal findings: Secondary | ICD-10-CM

## 2022-06-09 MED ORDER — ALBUTEROL SULFATE HFA 108 (90 BASE) MCG/ACT IN AERS: 1.0000 | INHALATION_SPRAY | Freq: Four times a day (QID) | RESPIRATORY_TRACT | 0 refills | Status: DC | PRN

## 2022-06-09 MED ORDER — HYDROCHLOROTHIAZIDE 25 MG PO TABS: ORAL_TABLET | ORAL | 1 refills | Status: DC

## 2022-06-09 MED ORDER — CLONIDINE HCL 0.1 MG PO TABS: 0.1000 mg | ORAL_TABLET | Freq: Every day | ORAL | 1 refills | Status: DC

## 2022-06-09 MED ORDER — AMLODIPINE BESYLATE 10 MG PO TABS: 10.0000 mg | ORAL_TABLET | Freq: Every day | ORAL | 1 refills | Status: DC

## 2022-06-09 MED ORDER — BUPROPION HCL 75 MG PO TABS: ORAL_TABLET | ORAL | 1 refills | Status: DC

## 2022-06-09 MED ORDER — SERTRALINE HCL 100 MG PO TABS: ORAL_TABLET | ORAL | 1 refills | Status: DC

## 2022-06-09 NOTE — Progress Notes (Signed)
[name], is a 66 y.o. female  [id number]  [id number]  DOB - [birth year]  Chief Complaint  Patient presents with   Hypertension       Subjective:   [name] is a 66 y.o.  obese female here today for a follow up visit for hypertension. Patient has No headache, No chest pain, No abdominal pain - No Nausea, No new weakness tingling or numbness, No Cough - shortness of breath  No problems updated.  No Known Allergies  Past Medical History:  Diagnosis Date   Depression    High cholesterol    Hypertension     Current Outpatient Medications on File Prior to Visit  Medication Sig Dispense Refill   atorvastatin (LIPITOR) 10 MG tablet TAKE 1 TABLET(10 MG) BY MOUTH DAILY 90 tablet 1   No current facility-administered medications on file prior to visit.    Objective:   Vitals:   06/09/22 1107  BP: (!) 150/82  Pulse: 74  Resp: 16  SpO2: 96%  Weight: 186 lb (84.4 kg)    Exam General appearance : Awake, alert, obese female , not in any distress. Speech Clear. Not toxic looking HEENT: Atraumatic and Normocephalic, pupils equally reactive to light and accomodation Neck: Supple, no JVD. No cervical lymphadenopathy.  Chest: Good air entry bilaterally, no added sounds  CVS: S1 S2 regular, no murmurs.  Abdomen: Bowel sounds present, Non tender and not distended with no gaurding, rigidity or rebound. Extremities: B/L Lower Ext shows no edema, both legs are warm to touch Neurology: Awake alert, and oriented X 3,  Non focal Skin: No Rash  Data Review Lab Results  Component Value Date   HGBA1C 5.5 12/07/2021    Assessment & Plan   1. Need for Streptococcus pneumoniae vaccination - Pneumococcal conjugate vaccine 20-valent  2. Need for immunization against influenza - Flu Vaccine QUAD High Dose(Fluad)  3. Postmenopausal - DG Bone Density; Future  4. Depression, unspecified depression type She is feeling affect  thoughts, feelings & sleep.   5. Medication refill - sertraline (ZOLOFT) 100 MG tablet; TAKE 1 TABLET(100 MG) BY MOUTH DAILY  Dispense: 90 tablet; Refill: 1 - buPROPion (WELLBUTRIN) 75 MG tablet; TAKE 1 TABLET(75 MG) BY MOUTH DAILY  Dispense: 90 tablet; Refill: 1 - albuterol (VENTOLIN HFA) 108 (90 Base) MCG/ACT inhaler; Inhale 1-2 puffs into the lungs every 6 (six) hours as needed for wheezing or shortness of breath.  Dispense: 1 each; Refill: 0  6. Healthcare maintenance Vaccines   7. Essential hypertension BP goal - < 140/90 Explained that having normal blood pressure is the goal and medications are helping to get to goal and maintain normal blood pressure. DIET: Limit salt intake, read nutrition labels to check salt content, limit fried and high fatty foods  Avoid using multisymptom OTC cold preparations that generally contain sudafed which can rise BP. Consult with pharmacist on best cold relief products to use for persons with HTN EXERCISE Discussed incorporating exercise such as walking - 30 minutes most days of the week and can do in 10 minute intervals    - cloNIDine (CATAPRES) 0.1 MG tablet; Take 1 tablet (0.1 mg total) by mouth daily. Take 1 tablet at bedtime by mouth  Dispense: 90 tablet; Refill: 1  Patient have been counseled extensively about nutrition and exercise. Other issues discussed during this visit include: low cholesterol diet, weight control and daily exercise, foot care, annual eye examinations at Ophthalmology, importance of adherence with medications and regular  follow-up. We also discussed long term complications of uncontrolled diabetes and hypertension.   Return in about 2 months (around 08/09/2022) for BP check.  The patient was given clear instructions to go to ER or return to medical center if symptoms don't improve, worsen or new problems develop. The patient verbalized understanding. The patient was told to call to get lab results if they haven't heard  anything in the next week.   This note has been created with Surveyor, quantity. Any transcriptional errors are unintentional.   Kerin Perna, NP 06/10/2022, 10:10 PM

## 2022-06-10 ENCOUNTER — Encounter (INDEPENDENT_AMBULATORY_CARE_PROVIDER_SITE_OTHER): Payer: Self-pay | Admitting: Primary Care

## 2022-06-10 LAB — TSH+FREE T4
Free T4: 1.28 ng/dL (ref 0.82–1.77)
TSH: 1.02 u[IU]/mL (ref 0.450–4.500)

## 2022-06-10 NOTE — Patient Instructions (Signed)
Clonidine Tablets What is this medication? CLONIDINE (KLOE ni deen) treats high blood pressure. It works by relaxing blood vessels, which decreases blood pressure and the amount of work the heart has to do. This medicine may be used for other purposes; ask your health care provider or pharmacist if you have questions. COMMON BRAND NAME(S): Catapres What should I tell my care team before I take this medication? They need to know if you have any of these conditions: Kidney disease An unusual or allergic reaction to clonidine, other medications, foods, dyes, or preservatives Pregnant or trying to get pregnant Breast-feeding How should I use this medication? Take this medication by mouth. Take it as directed on the prescription label at the same time every day. You can take it with or without food. If it upsets your stomach, take it with food. Keep taking it unless your care team tells you to stop. Talk to your care team about the use of this drug in children. Special care may be needed. Overdosage: If you think you have taken too much of this medicine contact a poison control center or emergency room at once. NOTE: This medicine is only for you. Do not share this medicine with others. What if I miss a dose? If you miss a dose, take it as soon as you can. If it is almost time for your next dose, take only that dose. Do not take double or extra doses. What may interact with this medication? Do not take this medication with any of the following: MAOIs like Carbex, Eldepryl, Marplan, Nardil, and Parnate This medication may also interact with the following: Barbiturate medications for inducing sleep or treating seizures like phenobarbital Certain medications for blood pressure, heart disease, irregular heart beat Certain medications for depression, anxiety, or psychotic disturbances Prescription pain medications This list may not describe all possible interactions. Give your health care provider a  list of all the medicines, herbs, non-prescription drugs, or dietary supplements you use. Also tell them if you smoke, drink alcohol, or use illegal drugs. Some items may interact with your medicine. What should I watch for while using this medication? Visit your care team for regular checks on your progress. Check your heart rate and blood pressure regularly while you are taking this medication. Ask your care team what your heart rate should be and when you should contact them. You may get drowsy or dizzy. Do not drive, use machinery, or do anything that needs mental alertness until you know how this medication affects you. To avoid dizzy or fainting spells, do not stand or sit up quickly, especially if you are an older person. Alcohol can make you more drowsy and dizzy. Avoid alcoholic drinks. Your mouth may get dry. Chewing sugarless gum or sucking hard candy, and drinking plenty of water will help. Do not treat yourself for coughs, colds, or pain while you are taking this medication without asking your care team for advice. Some ingredients may increase your blood pressure. If you are going to have surgery tell your care team that you are taking this medication. What side effects may I notice from receiving this medication? Side effects that you should report to your care team as soon as possible: Allergic reactions--skin rash, itching, hives, swelling of the face, lips, tongue, or throat Low blood pressure--dizziness, feeling faint or lightheaded, blurry vision Slow heartbeat--dizziness, feeling faint or lightheaded, confusion, trouble breathing, unusual weakness or fatigue Side effects that usually do not require medical attention (report to your care  team if they continue or are bothersome): Constipation Dizziness Drowsiness Dry eyes Dry mouth Fatigue This list may not describe all possible side effects. Call your doctor for medical advice about side effects. You may report side effects to  FDA at 1-800-FDA-1088. Where should I keep my medication? Keep out of the reach of children and pets. Store at room temperature between 15 and 30 degrees C (59 and 86 degrees F). Throw away any unused medication after the expiration date. NOTE: This sheet is a summary. It may not cover all possible information. If you have questions about this medicine, talk to your doctor, pharmacist, or health care provider.  2023 Elsevier/Gold Standard (2020-10-14 00:00:00)

## 2022-06-30 ENCOUNTER — Telehealth (INDEPENDENT_AMBULATORY_CARE_PROVIDER_SITE_OTHER): Payer: Self-pay | Admitting: Primary Care

## 2022-06-30 NOTE — Telephone Encounter (Signed)
Copied from [place] [id number]. Topic: General - Other >> Jun 30, 2022 11:31 AM Everette C wrote: Reason for CRM: Seth Bake with [facility] has requested contact regarding an invalid diagnosis for the patient as well as their insurance coverage   Please contact Seth Bake further when possible regarding this concern

## 2022-07-01 ENCOUNTER — Other Ambulatory Visit (INDEPENDENT_AMBULATORY_CARE_PROVIDER_SITE_OTHER): Payer: Self-pay | Admitting: Primary Care

## 2022-07-01 ENCOUNTER — Other Ambulatory Visit (INDEPENDENT_AMBULATORY_CARE_PROVIDER_SITE_OTHER): Payer: Self-pay

## 2022-07-01 DIAGNOSIS — E2839 Other primary ovarian failure: Secondary | ICD-10-CM

## 2022-07-01 NOTE — Telephone Encounter (Signed)
Looked at order and provider had dx code for postmenopausal. Provider has reorder bone density and place correct dx code.

## 2022-08-05 ENCOUNTER — Other Ambulatory Visit (INDEPENDENT_AMBULATORY_CARE_PROVIDER_SITE_OTHER): Payer: Self-pay | Admitting: Primary Care

## 2022-08-09 ENCOUNTER — Ambulatory Visit (INDEPENDENT_AMBULATORY_CARE_PROVIDER_SITE_OTHER): Payer: Medicare Other | Admitting: Primary Care

## 2022-08-09 ENCOUNTER — Encounter (INDEPENDENT_AMBULATORY_CARE_PROVIDER_SITE_OTHER): Payer: Self-pay | Admitting: Primary Care

## 2022-08-09 VITALS — BP 131/79 | HR 98 | Resp 16 | Ht 62.0 in | Wt 187.0 lb

## 2022-08-09 DIAGNOSIS — Z760 Encounter for issue of repeat prescription: Secondary | ICD-10-CM

## 2022-08-09 DIAGNOSIS — Z1231 Encounter for screening mammogram for malignant neoplasm of breast: Secondary | ICD-10-CM | POA: Diagnosis not present

## 2022-08-09 DIAGNOSIS — I10 Essential (primary) hypertension: Secondary | ICD-10-CM

## 2022-08-09 DIAGNOSIS — E2839 Other primary ovarian failure: Secondary | ICD-10-CM | POA: Diagnosis not present

## 2022-08-09 DIAGNOSIS — F32A Depression, unspecified: Secondary | ICD-10-CM

## 2022-08-09 DIAGNOSIS — E785 Hyperlipidemia, unspecified: Secondary | ICD-10-CM

## 2022-08-09 MED ORDER — BUPROPION HCL 75 MG PO TABS: ORAL_TABLET | ORAL | 1 refills | Status: DC

## 2022-08-09 MED ORDER — SERTRALINE HCL 100 MG PO TABS: ORAL_TABLET | ORAL | 1 refills | Status: DC

## 2022-08-09 MED ORDER — CLONIDINE HCL 0.1 MG PO TABS: 0.1000 mg | ORAL_TABLET | Freq: Every day | ORAL | 1 refills | Status: DC

## 2022-08-09 MED ORDER — HYDROCHLOROTHIAZIDE 25 MG PO TABS: ORAL_TABLET | ORAL | 1 refills | Status: DC

## 2022-08-09 MED ORDER — AMLODIPINE BESYLATE 10 MG PO TABS: ORAL_TABLET | ORAL | 1 refills | Status: DC

## 2022-08-10 LAB — CMP14+EGFR
ALT: 15 IU/L (ref 0–32)
AST: 15 IU/L (ref 0–40)
Albumin/Globulin Ratio: 1.3 (ref 1.2–2.2)
Albumin: 4.4 g/dL (ref 3.9–4.9)
Alkaline Phosphatase: 52 IU/L (ref 44–121)
BUN/Creatinine Ratio: 17 (ref 12–28)
BUN: 13 mg/dL (ref 8–27)
Bilirubin Total: 0.4 mg/dL (ref 0.0–1.2)
CO2: 26 mmol/L (ref 20–29)
Calcium: 9.6 mg/dL (ref 8.7–10.3)
Chloride: 104 mmol/L (ref 96–106)
Creatinine, Ser: 0.78 mg/dL (ref 0.57–1.00)
Globulin, Total: 3.3 g/dL (ref 1.5–4.5)
Glucose: 88 mg/dL (ref 70–99)
Potassium: 4.4 mmol/L (ref 3.5–5.2)
Sodium: 141 mmol/L (ref 134–144)
Total Protein: 7.7 g/dL (ref 6.0–8.5)
eGFR: 84 mL/min/{1.73_m2} (ref >59)

## 2022-08-10 LAB — LIPID PANEL
Chol/HDL Ratio: 3.4 ratio (ref 0.0–4.4)
Cholesterol, Total: 145 mg/dL (ref 100–199)
HDL: 43 mg/dL (ref >39)
LDL Chol Calc (NIH): 85 mg/dL (ref 0–99)
Triglycerides: 92 mg/dL (ref 0–149)
VLDL Cholesterol Cal: 17 mg/dL (ref 5–40)

## 2022-08-14 NOTE — Progress Notes (Signed)
[place]   Chelsea Walters is a 66 y.o. obese female presents for hypertension evaluation, Denies shortness of breath, headaches, chest pain or lower extremity edema, sudden onset, vision changes, unilateral weakness, dizziness, paresthesias.  Patient reports adherence with medications.  Dietary habits include: Dash diet Exercise habits include: As tolerated Family / Social history: Unknown   Past Medical History:  Diagnosis Date   Depression    High cholesterol    Hypertension    Past Surgical History:  Procedure Laterality Date   HAND SURGERY Left    No Known Allergies Current Outpatient Medications on File Prior to Visit  Medication Sig Dispense Refill   albuterol (VENTOLIN HFA) 108 (90 Base) MCG/ACT inhaler Inhale 1-2 puffs into the lungs every 6 (six) hours as needed for wheezing or shortness of breath. 1 each 0   atorvastatin (LIPITOR) 10 MG tablet TAKE 1 TABLET(10 MG) BY MOUTH DAILY 90 tablet 1   No current facility-administered medications on file prior to visit.   Social History   Socioeconomic History   Marital status: Widowed    Spouse name: Not on file   Number of children: Not on file   Years of education: Not on file   Highest education level: Not on file  Occupational History   Not on file  Tobacco Use   Smoking status: Never   Smokeless tobacco: Never  Substance and Sexual Activity   Alcohol use: Never   Drug use: Never   Sexual activity: Not Currently  Other Topics Concern   Not on file  Social History Narrative   Not on file   Social Determinants of Health   Financial Resource Strain: Not on file  Food Insecurity: Not on file  Transportation Needs: Not on file  Physical Activity: Not on file  Stress: Not on file  Social Connections: Not on file  Intimate Partner Violence: Not on file   No family history on file.   OBJECTIVE:  Vitals:   08/09/22 1117  BP: 131/79  Pulse: 98  Resp: 16  SpO2: 100%   Weight: 187 lb (84.8 kg)  Height: _0  (1.575 m)    Physical Exam Vitals reviewed.  Constitutional:      Appearance: She is obese.  HENT:     Head: Normocephalic.     Right Ear: Tympanic membrane and external ear normal.     Left Ear: Tympanic membrane and external ear normal.  Eyes:     Extraocular Movements: Extraocular movements intact.     Pupils: Pupils are equal, round, and reactive to light.  Cardiovascular:     Rate and Rhythm: Normal rate and regular rhythm.  Pulmonary:     Effort: Pulmonary effort is normal.     Breath sounds: Normal breath sounds.  Abdominal:     General: Bowel sounds are normal. There is distension.  Musculoskeletal:        General: Normal range of motion.     Cervical back: Normal range of motion and neck supple.  Skin:    General: Skin is warm and dry.  Neurological:     Mental Status: She is alert.  Psychiatric:        Mood and Affect: Mood normal.     ROS Comprehensive ROS Pertinent positive and negative noted in HPI   Last 3 Office BP readings: BP Readings from Last 3 Encounters:  08/09/22 131/79  06/09/22 (Abnormal) 150/82  12/07/21 140/85    BMET    Component Value  Date/Time   NA 141 08/09/2022 1132   K 4.4 08/09/2022 1132   CL 104 08/09/2022 1132   CO2 26 08/09/2022 1132   GLUCOSE 88 08/09/2022 1132   BUN 13 08/09/2022 1132   CREATININE 0.78 08/09/2022 1132   CALCIUM 9.6 08/09/2022 1132   GFRNONAA 75 11/02/2020 1421   GFRAA 87 11/02/2020 1421    Renal function: Estimated Creatinine Clearance: 70.8 mL/min (by C-G formula based on SCr of 0.78 mg/dL).  Clinical ASCVD: No  The 10-year ASCVD risk score (Arnett DK, et al., 2019) is: 7.7%   Values used to calculate the score:     Age: 15 years     Sex: Female     Is Non-Hispanic African American: Yes     Diabetic: No     Tobacco smoker: No     Systolic Blood Pressure: 235 mmHg     Is BP treated: Yes     HDL Cholesterol: 43 mg/dL     Total Cholesterol: 145  mg/dL  ASCVD risk factors include- [place]  Meds ordered this encounter  Medications   amLODipine (NORVASC) 10 MG tablet    Sig: TAKE 1 TABLET(10 MG) BY MOUTH DAILY    Dispense:  90 tablet    Refill:  1    Order Specific Question:   Supervising Provider    Answer:   Tresa Garter [[id number]]   hydrochlorothiazide (HYDRODIURIL) 25 MG tablet    Sig: Take 1 tablet by mouth every morning    Dispense:  90 tablet    Refill:  1    Order Specific Question:   Supervising Provider    Answer:   Tresa Garter [[id number]]   cloNIDine (CATAPRES) 0.1 MG tablet    Sig: Take 1 tablet (0.1 mg total) by mouth daily. Take 1 tablet at bedtime by mouth    Dispense:  90 tablet    Refill:  1    Order Specific Question:   Supervising Provider    Answer:   Tresa Garter [[id number]]   sertraline (ZOLOFT) 100 MG tablet    Sig: TAKE 1 TABLET(100 MG) BY MOUTH DAILY    Dispense:  90 tablet    Refill:  1    ZERO refills remain on this prescription. Your patient is requesting advance approval of refills for this medication to [place]    Order Specific Question:   Supervising Provider    Answer:   Tresa Garter [[id number]]   buPROPion (WELLBUTRIN) 75 MG tablet    Sig: TAKE 1 TABLET(75 MG) BY MOUTH DAILY    Dispense:  90 tablet    Refill:  1    ZERO refills remain on this prescription. Your patient is requesting advance approval of refills for this medication to [place]    Order Specific Question:   Supervising Provider    Answer:   Tresa Garter [[id number]]   Linday was seen today for hypertension.  Diagnoses and all orders for this visit:  Estrogen deficiency Health maintenance -     DG Bone Density; Future  Encounter for screening mammogram for malignant neoplasm of breast Health maintenance -     MM DIGITAL SCREENING BILATERAL; Future  Medication refill -     amLODipine (NORVASC) 10 MG tablet; TAKE 1 TABLET(10 MG) BY MOUTH DAILY -      hydrochlorothiazide (HYDRODIURIL) 25 MG tablet; Take 1 tablet by mouth every morning -     cloNIDine (CATAPRES) 0.1 MG  tablet; Take 1 tablet (0.1 mg total) by mouth daily. Take 1 tablet at bedtime by mouth -     sertraline (ZOLOFT) 100 MG tablet; TAKE 1 TABLET(100 MG) BY MOUTH DAILY -     buPROPion (WELLBUTRIN) 75 MG tablet; TAKE 1 TABLET(75 MG) BY MOUTH DAILY  Depression, unspecified depression type Medication dosage is appropriate decrease anxiety, depression and insomnia -     sertraline (ZOLOFT) 100 MG tablet; TAKE 1 TABLET(100 MG) BY MOUTH DAILY -     buPROPion (WELLBUTRIN) 75 MG tablet; TAKE 1 TABLET(75 MG) BY MOUTH DAILY  Hyperlipidemia, unspecified hyperlipidemia type Previously on atorvastatin 10 mg for reduction in CVD patient was not taking medication on a regular basis Will check labs -     Lipid Panel   Essential hypertension blood pressure is at goal less than 140/90 -Counseled on lifestyle modifications for blood pressure control including reduced dietary sodium, increased exercise, weight reduction and adequate sleep. Also, educated patient about the risk for cardiovascular events, stroke and heart attack. Also counseled patient about the importance of medication adherence. If you participate in smoking, it is important to stop using tobacco as this will increase the risks associated with uncontrolled blood pressure.  -     cloNIDine (CATAPRES) 0.1 MG tablet; Take 1 tablet (0.1 mg total) by mouth daily. Take 1 tablet at bedtime by mouth -     CMP14+EGFR Goal BP:  For patients younger than 60: Goal BP < 130/80. For patients 60 and older: Goal BP < 140/90. For patients with diabetes: Goal BP < 130/80. Your most recent BP: 131/79  Minimize salt intake. Minimize alcohol intake    This note has been created with Surveyor, quantity. Any transcriptional errors are unintentional.   Kerin Perna, NP 08/14/2022, 9:01 PM

## 2022-08-16 ENCOUNTER — Ambulatory Visit
Admission: RE | Admit: 2022-08-16 | Discharge: 2022-08-16 | Disposition: A | Discharge status: Home / Self Care | Payer: Medicare Other | Source: Ambulatory Visit | Attending: Primary Care | Admitting: Primary Care

## 2022-08-16 DIAGNOSIS — Z1231 Encounter for screening mammogram for malignant neoplasm of breast: Secondary | ICD-10-CM

## 2022-08-16 DIAGNOSIS — E2839 Other primary ovarian failure: Secondary | ICD-10-CM

## 2022-08-21 ENCOUNTER — Other Ambulatory Visit (INDEPENDENT_AMBULATORY_CARE_PROVIDER_SITE_OTHER): Payer: Self-pay | Admitting: Primary Care

## 2022-08-21 MED ORDER — ALENDRONATE SODIUM 70 MG PO TABS: 70.0000 mg | ORAL_TABLET | ORAL | 11 refills | Status: DC

## 2022-10-15 ENCOUNTER — Other Ambulatory Visit (INDEPENDENT_AMBULATORY_CARE_PROVIDER_SITE_OTHER): Payer: Self-pay | Admitting: Primary Care

## 2022-10-20 ENCOUNTER — Other Ambulatory Visit (INDEPENDENT_AMBULATORY_CARE_PROVIDER_SITE_OTHER): Payer: Self-pay | Admitting: Primary Care

## 2022-10-20 ENCOUNTER — Other Ambulatory Visit (INDEPENDENT_AMBULATORY_CARE_PROVIDER_SITE_OTHER): Payer: Self-pay

## 2022-10-20 ENCOUNTER — Telehealth (INDEPENDENT_AMBULATORY_CARE_PROVIDER_SITE_OTHER): Payer: Self-pay | Admitting: Primary Care

## 2022-10-20 DIAGNOSIS — Z760 Encounter for issue of repeat prescription: Secondary | ICD-10-CM

## 2022-10-20 DIAGNOSIS — I10 Essential (primary) hypertension: Secondary | ICD-10-CM

## 2022-10-20 MED ORDER — ATORVASTATIN CALCIUM 10 MG PO TABS: ORAL_TABLET | ORAL | 0 refills | Status: DC

## 2022-10-20 NOTE — Telephone Encounter (Signed)
Medication Refill - Medication: cloNIDine (CATAPRES) 0.1 MG tablet   Has the patient contacted their pharmacy? Yes.    Pt was referred to PCP  Preferred Pharmacy (with phone number or street name):  [place], [facility] Phone: [phone]  Fax: [phone]     Has the patient been seen for an appointment in the last year OR does the patient have an upcoming appointment? Yes.    Agent: Please be advised that RX refills may take up to 3 business days. We ask that you follow-up with your pharmacy.

## 2022-10-20 NOTE — Telephone Encounter (Signed)
Rx has been sent  

## 2022-10-20 NOTE — Telephone Encounter (Signed)
Pt is calling to see if she can get a short supply of her medication atorvastatin (LIPITOR) 10 MG tablet until her next appointment on 11/09/2022.  Pt is out of medication.  Please advise

## 2022-10-21 NOTE — Telephone Encounter (Signed)
Unable to refill per protocol, Rx request is too soon. Last refill 08/09/22 for 90 and 1 refill.  Requested Prescriptions  Pending Prescriptions Disp Refills   cloNIDine (CATAPRES) 0.1 MG tablet 90 tablet 1    Sig: Take 1 tablet (0.1 mg total) by mouth daily. Take 1 tablet at bedtime by mouth     Cardiovascular:  Alpha-2 Agonists Passed - 10/20/2022  4:04 PM      Passed - Last BP in normal range    BP Readings from Last 1 Encounters:  08/09/22 131/79         Passed - Last Heart Rate in normal range    Pulse Readings from Last 1 Encounters:  08/09/22 98         Passed - Valid encounter within last 6 months    Recent Outpatient Visits           2 months ago Estrogen deficiency   [place], [place] P, NP   4 months ago Need for Streptococcus pneumoniae vaccination   [place] [facility] Kerin Perna, NP   10 months ago Need for shingles vaccine   [place] [facility] Kerin Perna, NP   1 year ago Cervical cancer screening   [place] [facility] Kerin Perna, NP   1 year ago Essential hypertension   [place], [place], NP       Future Appointments             In 2 weeks Oletta Lamas, [place] Cage, NP [place]

## 2022-11-09 ENCOUNTER — Ambulatory Visit: Payer: Medicare Other | Admitting: Physician Assistant

## 2022-11-09 ENCOUNTER — Encounter: Payer: Self-pay | Admitting: Physician Assistant

## 2022-11-09 VITALS — BP 137/79 | HR 66 | Ht 62.0 in | Wt 180.0 lb

## 2022-11-09 DIAGNOSIS — I10 Essential (primary) hypertension: Secondary | ICD-10-CM | POA: Diagnosis not present

## 2022-11-09 DIAGNOSIS — F330 Major depressive disorder, recurrent, mild: Secondary | ICD-10-CM

## 2022-11-09 DIAGNOSIS — E6609 Other obesity due to excess calories: Secondary | ICD-10-CM

## 2022-11-09 DIAGNOSIS — Z6832 Body mass index (BMI) 32.0-32.9, adult: Secondary | ICD-10-CM

## 2022-11-09 DIAGNOSIS — M545 Low back pain, unspecified: Secondary | ICD-10-CM | POA: Diagnosis not present

## 2022-11-09 DIAGNOSIS — E782 Mixed hyperlipidemia: Secondary | ICD-10-CM | POA: Insufficient documentation

## 2022-11-09 DIAGNOSIS — F5104 Psychophysiologic insomnia: Secondary | ICD-10-CM | POA: Diagnosis not present

## 2022-11-09 DIAGNOSIS — J302 Other seasonal allergic rhinitis: Secondary | ICD-10-CM

## 2022-11-09 MED ORDER — AMLODIPINE BESYLATE 10 MG PO TABS: ORAL_TABLET | ORAL | 1 refills | Status: DC

## 2022-11-09 MED ORDER — ALBUTEROL SULFATE HFA 108 (90 BASE) MCG/ACT IN AERS: 1.0000 | INHALATION_SPRAY | Freq: Four times a day (QID) | RESPIRATORY_TRACT | 0 refills | Status: DC | PRN

## 2022-11-09 MED ORDER — SERTRALINE HCL 100 MG PO TABS: ORAL_TABLET | ORAL | 1 refills | Status: DC

## 2022-11-09 MED ORDER — CLONIDINE HCL 0.1 MG PO TABS: 0.1000 mg | ORAL_TABLET | Freq: Every day | ORAL | 1 refills | Status: DC

## 2022-11-09 MED ORDER — ATORVASTATIN CALCIUM 10 MG PO TABS: ORAL_TABLET | ORAL | 1 refills | Status: DC

## 2022-11-09 MED ORDER — CETIRIZINE HCL 10 MG PO TABS: 10.0000 mg | ORAL_TABLET | Freq: Every day | ORAL | 11 refills | Status: DC

## 2022-11-09 MED ORDER — HYDROCHLOROTHIAZIDE 25 MG PO TABS: ORAL_TABLET | ORAL | 1 refills | Status: DC

## 2022-11-09 MED ORDER — BUPROPION HCL ER (XL) 150 MG PO TB24: 150.0000 mg | ORAL_TABLET | Freq: Every day | ORAL | 2 refills | Status: DC

## 2022-11-09 NOTE — Patient Instructions (Addendum)
To help with your sleep, I encourage you to increase melatonin to either 8 mg or 10 mg at bedtime and then have clonidine available if you wake up.  To help with your depressed moods, we will increase Wellbutrin 150 mg once daily.  This should also offer you some appetite suppression to help you with your weight loss.  I encourage you to increase your water intake to at least 64 ounces a day, work on mindfulness of reducing snacking.  I sent over prescription for Zyrtec, I encourage you to start this on a daily basis to help avoid flareups of allergies.  I encourage you to check your blood pressure at home, keep a written log and have available for all office visits.  Kennieth Rad, PA-C Physician Assistant [place] [email or web address]   How to Take Your Blood Pressure Blood pressure is a measurement of how strongly your blood is pressing against the walls of your arteries. Arteries are blood vessels that carry blood from your heart throughout your body. Your health care provider takes your blood pressure at each office visit. You can also take your own blood pressure at home with a blood pressure monitor. You may need to take your own blood pressure to: Confirm a diagnosis of high blood pressure (hypertension). Monitor your blood pressure over time. Make sure your blood pressure medicine is working. Supplies needed: Blood pressure monitor. A chair to sit in. This should be a chair where you can sit upright with your back supported. Do not sit on a soft couch or an armchair. Table or desk. Small notebook and pencil or pen. How to prepare To get the most accurate reading, avoid the following for 30 minutes before you check your blood pressure: Drinking caffeine. Drinking alcohol. Eating. Smoking. Exercising. Five minutes before you check your blood pressure: Use the bathroom and urinate so that you have an empty bladder. Sit  quietly in a chair. Do not talk. How to take your blood pressure To check your blood pressure, follow the instructions in the manual that came with your blood pressure monitor. If you have a digital blood pressure monitor, the instructions may be as follows: Sit up straight in a chair. Place your feet on the floor. Do not cross your ankles or legs. Rest your left arm at the level of your heart on a table or desk or on the arm of a chair. Pull up your shirt sleeve. Wrap the blood pressure cuff around the upper part of your left arm, 1 inch (2.5 cm) above your elbow. It is best to wrap the cuff around bare skin. Fit the cuff snugly, but not too tightly, around your arm. You should be able to place only one finger between the cuff and your arm. Position the cord so that it rests in the bend of your elbow. Press the power button. Sit quietly while the cuff inflates and deflates. Read the digital reading on the monitor screen and write the numbers down (record them) in a notebook. Wait 2-3 minutes, then repeat the steps, starting at step 1. What does my blood pressure reading mean? A blood pressure reading consists of a higher number over a lower number. Ideally, your blood pressure should be below 120/80. The first ("top") number is called the systolic pressure. It is a measure of the pressure in your arteries as your heart beats. The second ("bottom") number is called the diastolic pressure. It is a measure of the pressure in your  arteries as the heart relaxes. Blood pressure is classified into four stages. The following are the stages for adults who do not have a short-term serious illness or a chronic condition. Systolic pressure and diastolic pressure are measured in a unit called mm Hg (millimeters of mercury).  Normal Systolic pressure: below 123456. Diastolic pressure: below 80. Elevated Systolic pressure: [id number]. Diastolic pressure: below 80. Hypertension stage 1 Systolic pressure:  0000000. Diastolic pressure: [id number]. Hypertension stage 2 Systolic pressure: [id number] or above. Diastolic pressure: 90 or above. You can have elevated blood pressure or hypertension even if only the systolic or only the diastolic number in your reading is higher than normal. Follow these instructions at home: Medicines Take over-the-counter and prescription medicines only as told by your health care provider. Tell your health care provider if you are having any side effects from blood pressure medicine. General instructions Check your blood pressure as often as recommended by your health care provider. Check your blood pressure at the same time every day. Take your monitor to the next appointment with your health care provider to make sure that: You are using it correctly. It provides accurate readings. Understand what your goal blood pressure numbers are. Keep all follow-up visits. This is important. General tips Your health care provider can suggest a reliable monitor that will meet your needs. There are several types of home blood pressure monitors. Choose a monitor that has an arm cuff. Do not choose a monitor that measures your blood pressure from your wrist or finger. Choose a cuff that wraps snugly, not too tight or too loose, around your upper arm. You should be able to fit only one finger between your arm and the cuff. You can buy a blood pressure monitor at most drugstores or online. Where to find more information American Heart Association: www.heart.org Contact a health care provider if: Your blood pressure is consistently high. Your blood pressure is suddenly low. Get help right away if: Your systolic blood pressure is higher than 180. Your diastolic blood pressure is higher than 120. These symptoms may be an emergency. Get help right away. Call 911. Do not wait to see if the symptoms will go away. Do not drive yourself to the hospital. Summary Blood pressure is a  measurement of how strongly your blood is pressing against the walls of your arteries. A blood pressure reading consists of a higher number over a lower number. Ideally, your blood pressure should be below 120/80. Check your blood pressure at the same time every day. Avoid caffeine, alcohol, smoking, and exercise for 30 minutes prior to checking your blood pressure. These agents can affect the accuracy of the blood pressure reading. This information is not intended to replace advice given to you by your health care provider. Make sure you discuss any questions you have with your health care provider. Document Revised: 05/13/2021 Document Reviewed: 05/13/2021 Elsevier Patient Education  [place].

## 2022-11-09 NOTE — Progress Notes (Signed)
Established Patient Office Visit  Subjective   Patient ID: Chelsea Walters, female    DOB: [birth year]  Age: 67 y.o. MRN: [id number]  Chief Complaint  Patient presents with   Follow-up    Bp, medication refills, c/o right side pain x2 weeks     States that she has been having some depressed moods, wishes that she would "get out more" states that she has been compliant to her medications.  States that she does not feel the clonidine is offering as much relief as it did when she first started taking it.  States that she is having a hard time staying asleep.  States that she may sleep 6 hours at the most.  States that she also takes 5 mg of melatonin.  States that she has been having intermittent pain on the lower right side of her back, states that she does not know of any injury or trauma but does endorse working in her yard and it started shortly after that.  States that has been going on for couple of weeks.  Describes it as achy.  Denies radiation, numbness or tingling.  Has not tried anything for relief.  States that she has been trying to work on weight loss, states that she is drinking approximately 32 ounces of water a day, states that she does tend to grab at snacks when she is hungry since she lives by herself and does not cook for other people.    States that she does use her inhaler as needed, states that last week she did have congestion and needed to use it a couple of times.  States she does not take a daily allergy medication.  States that she was told by her ophthalmologist that she needed to have cataract surgery.  States that she does plan on following up with them to have that scheduled.      11/09/2022   10:59 AM 08/09/2022   11:17 AM 12/07/2021   10:15 AM 03/26/2021    9:54 AM 12/24/2020    3:09 PM  Depression screen PHQ 2/9  Decreased Interest 1 0 1 0 0  Down, Depressed, Hopeless 1 0 1 0 1  PHQ - 2 Score 2 0 2 0 1  Altered sleeping 0  2  1  Tired, decreased energy  1  1  0  Change in appetite 0  0  1  Feeling bad or failure about yourself  0  0  0  Trouble concentrating 0  0  0  Moving slowly or fidgety/restless 0  0  0  Suicidal thoughts 0  0  0  PHQ-9 Score '3  5  3  '$ Difficult doing work/chores Somewhat difficult  Not difficult at all  Somewhat difficult      11/09/2022   11:01 AM 08/09/2022   11:17 AM 12/07/2021   10:15 AM 03/26/2021    9:54 AM  GAD 7 : Generalized Anxiety Score  Nervous, Anxious, on Edge 0 0 1 0  Control/stop worrying 0 0 1 0  Worry too much - different things 0 0 0 0  Trouble relaxing 0 0 0 0  Restless 0 0 0 0  Easily annoyed or irritable 0 0 0 0  Afraid - awful might happen 3 0 0 0  Total GAD 7 Score 3 0 2 0  Anxiety Difficulty Somewhat difficult  Not difficult at all Not difficult at all     Past Medical History:  Diagnosis Date  Depression    High cholesterol    Hypertension    Social History   Socioeconomic History   Marital status: Widowed    Spouse name: Not on file   Number of children: Not on file   Years of education: Not on file   Highest education level: Not on file  Occupational History   Not on file  Tobacco Use   Smoking status: Never   Smokeless tobacco: Never  Substance and Sexual Activity   Alcohol use: Never   Drug use: Never   Sexual activity: Not Currently  Other Topics Concern   Not on file  Social History Narrative   Not on file   Social Determinants of Health   Financial Resource Strain: Not on file  Food Insecurity: Not on file  Transportation Needs: Not on file  Physical Activity: Not on file  Stress: Not on file  Social Connections: Not on file  Intimate Partner Violence: Not on file   History reviewed. No pertinent family history. No Known Allergies  Review of Systems  Constitutional: Negative.   HENT: Negative.    Eyes: Negative.   Respiratory:  Negative for shortness of breath.   Cardiovascular:  Negative for chest pain.  Gastrointestinal: Negative.    Genitourinary:  Negative for dysuria.  Musculoskeletal:  Positive for back pain and myalgias.  Skin: Negative.   Neurological: Negative.   Endo/Heme/Allergies: Negative.   Psychiatric/Behavioral:  Positive for depression. The patient has insomnia. The patient is not nervous/anxious.       Objective:     BP 137/79 (BP Location: Left Arm, Patient Position: Sitting, Cuff Size: Large)   Pulse 66   Ht '5\' 2"'$  (1.575 m)   Wt 180 lb (81.6 kg)   SpO2 98%   BMI 32.92 kg/m  BP Readings from Last 3 Encounters:  11/09/22 137/79  08/09/22 131/79  06/09/22 (!) 150/82   Wt Readings from Last 3 Encounters:  11/09/22 180 lb (81.6 kg)  08/09/22 187 lb (84.8 kg)  06/09/22 186 lb (84.4 kg)      Physical Exam Vitals and nursing note reviewed.  Constitutional:      Appearance: Normal appearance.  HENT:     Head: Normocephalic and atraumatic.     Right Ear: External ear normal.     Left Ear: External ear normal.     Nose: Nose normal.     Mouth/Throat:     Mouth: Mucous membranes are moist.     Pharynx: Oropharynx is clear.  Eyes:     Extraocular Movements: Extraocular movements intact.     Conjunctiva/sclera: Conjunctivae normal.     Pupils: Pupils are equal, round, and reactive to light.  Cardiovascular:     Rate and Rhythm: Normal rate and regular rhythm.     Pulses: Normal pulses.     Heart sounds: Normal heart sounds.  Pulmonary:     Effort: Pulmonary effort is normal.     Breath sounds: Normal breath sounds.  Musculoskeletal:     Cervical back: Normal, normal range of motion and neck supple.     Thoracic back: Normal.     Lumbar back: No swelling or tenderness. Normal range of motion.  Skin:    General: Skin is warm and dry.  Neurological:     General: No focal deficit present.     Mental Status: She is alert and oriented to person, place, and time.  Psychiatric:        Mood and Affect: Mood normal.  Behavior: Behavior normal.        Thought Content: Thought  content normal.        Judgment: Judgment normal.         Assessment & Plan:   Problem List Items Addressed This Visit       Cardiovascular and Mediastinum   Essential hypertension - Primary   Relevant Medications   amLODipine (NORVASC) 10 MG tablet   atorvastatin (LIPITOR) 10 MG tablet   cloNIDine (CATAPRES) 0.1 MG tablet   hydrochlorothiazide (HYDRODIURIL) 25 MG tablet     Other   Psychophysiological insomnia   Relevant Medications   cloNIDine (CATAPRES) 0.1 MG tablet   Mixed hyperlipidemia   Relevant Medications   amLODipine (NORVASC) 10 MG tablet   atorvastatin (LIPITOR) 10 MG tablet   cloNIDine (CATAPRES) 0.1 MG tablet   hydrochlorothiazide (HYDRODIURIL) 25 MG tablet   Other Visit Diagnoses     Acute right-sided low back pain without sciatica       Mild episode of recurrent major depressive disorder (HCC)       Relevant Medications   sertraline (ZOLOFT) 100 MG tablet   buPROPion (WELLBUTRIN XL) 150 MG 24 hr tablet   Seasonal allergies       Relevant Medications   albuterol (VENTOLIN HFA) 108 (90 Base) MCG/ACT inhaler   cetirizine (ZYRTEC ALLERGY) 10 MG tablet   Class 1 obesity due to excess calories with serious comorbidity and body mass index (BMI) of 32.0 to 32.9 in adult         1. Essential hypertension Continue current regimen.  Patient encouraged to check blood pressure at home, keep a written log and have available for all office visits.  Red flags given for prompt reevaluation.  Patient given appointment for Medicare annual wellness exam to be completed by her primary care provider. - amLODipine (NORVASC) 10 MG tablet; TAKE 1 TABLET(10 MG) BY MOUTH DAILY  Dispense: 90 tablet; Refill: 1 - hydrochlorothiazide (HYDRODIURIL) 25 MG tablet; Take 1 tablet by mouth every morning  Dispense: 90 tablet; Refill: 1  2. Acute right-sided low back pain without sciatica Patient education given on supportive care  3. Mild episode of recurrent major depressive  disorder (HCC) Continue Zoloft, increase Wellbutrin.  Patient education given on coping skills - sertraline (ZOLOFT) 100 MG tablet; TAKE 1 TABLET(100 MG) BY MOUTH DAILY  Dispense: 90 tablet; Refill: 1 - buPROPion (WELLBUTRIN XL) 150 MG 24 hr tablet; Take 1 tablet (150 mg total) by mouth daily.  Dispense: 30 tablet; Refill: 2  4. Psychophysiological insomnia Continue current regimen, encourage patient to increase dosing of melatonin, encouraged to wait until later in the night to take clonidine.  Patient understands and agrees - cloNIDine (CATAPRES) 0.1 MG tablet; Take 1 tablet (0.1 mg total) by mouth daily. Take 1 tablet at bedtime by mouth  Dispense: 90 tablet; Refill: 1  5. Mixed hyperlipidemia Continue current regimen - atorvastatin (LIPITOR) 10 MG tablet; TAKE 1 TABLET(10 MG) BY MOUTH DAILY  Dispense: 90 tablet; Refill: 1   6. Seasonal allergies Continue albuterol inhaler as needed, trial Zyrtec. - albuterol (VENTOLIN HFA) 108 (90 Base) MCG/ACT inhaler; Inhale 1-2 puffs into the lungs every 6 (six) hours as needed for wheezing or shortness of breath.  Dispense: 1 each; Refill: 0 - cetirizine (ZYRTEC ALLERGY) 10 MG tablet; Take 1 tablet (10 mg total) by mouth daily.  Dispense: 30 tablet; Refill: 11  7. Class 1 obesity due to excess calories with serious comorbidity and body mass index (BMI)  of 32.0 to 32.9 in adult Reviewed lifestyle modifications with patient   I have reviewed the patient's medical history (PMH, PSH, Social History, Family History, Medications, and allergies) , and have been updated if relevant. I spent 30 minutes reviewing chart and  face to face time with patient.    Return if symptoms worsen or fail to improve.    Loraine Grip Mayers, PA-C

## 2022-12-12 ENCOUNTER — Ambulatory Visit (INDEPENDENT_AMBULATORY_CARE_PROVIDER_SITE_OTHER): Payer: Medicare Other | Admitting: Primary Care

## 2022-12-12 ENCOUNTER — Encounter (INDEPENDENT_AMBULATORY_CARE_PROVIDER_SITE_OTHER): Payer: Self-pay | Admitting: Primary Care

## 2022-12-12 VITALS — BP 134/81 | HR 72 | Resp 16 | Ht 62.0 in | Wt 182.0 lb

## 2022-12-12 DIAGNOSIS — Z0000 Encounter for general adult medical examination without abnormal findings: Secondary | ICD-10-CM | POA: Diagnosis not present

## 2022-12-12 NOTE — Progress Notes (Signed)
Subjective:    Chelsea Walters is a 67 y.o. female who presents for a Welcome to Medicare exam.   Review of Systems Negative         Objective:    Today's Vitals   12/12/22 1044  BP: 134/81  Pulse: 72  Resp: 16  SpO2: 97%  Weight: 182 lb (82.6 kg)  Height: 5\' 2"  (1.575 m)  Body mass index is 33.29 kg/m.  Medications Outpatient Encounter Medications as of 12/12/2022  Medication Sig   albuterol (VENTOLIN HFA) 108 (90 Base) MCG/ACT inhaler Inhale 1-2 puffs into the lungs every 6 (six) hours as needed for wheezing or shortness of breath.   alendronate (FOSAMAX) 70 MG tablet Take 1 tablet (70 mg total) by mouth every 7 (seven) days. Take with a full glass of water on an empty stomach.   amLODipine (NORVASC) 10 MG tablet TAKE 1 TABLET(10 MG) BY MOUTH DAILY   atorvastatin (LIPITOR) 10 MG tablet TAKE 1 TABLET(10 MG) BY MOUTH DAILY   buPROPion (WELLBUTRIN XL) 150 MG 24 hr tablet Take 1 tablet (150 mg total) by mouth daily.   cetirizine (ZYRTEC ALLERGY) 10 MG tablet Take 1 tablet (10 mg total) by mouth daily.   cloNIDine (CATAPRES) 0.1 MG tablet Take 1 tablet (0.1 mg total) by mouth daily. Take 1 tablet at bedtime by mouth   hydrochlorothiazide (HYDRODIURIL) 25 MG tablet Take 1 tablet by mouth every morning   sertraline (ZOLOFT) 100 MG tablet TAKE 1 TABLET(100 MG) BY MOUTH DAILY   No facility-administered encounter medications on file as of 12/12/2022.     History: Past Medical History:  Diagnosis Date   Depression    High cholesterol    Hypertension    Past Surgical History:  Procedure Laterality Date   HAND SURGERY Left     No family history on file. Social History   Occupational History   Not on file  Tobacco Use   Smoking status: Never   Smokeless tobacco: Never  Substance and Sexual Activity   Alcohol use: Never   Drug use: Never   Sexual activity: Not Currently    Tobacco Counseling Counseling given: Not Answered   Immunizations and Health  Maintenance Immunization History  Administered Date(s) Administered   Fluad Quad(high Dose 65+) 06/09/2022   Hepatitis B 02/27/2016, 04/02/2016   Influenza,inj,Quad PF,6+ Mos 12/07/2021   Influenza-Unspecified 08/26/2016, 09/19/2017, 08/31/2018   PFIZER(Purple Top)SARS-COV-2 Vaccination 12/14/2019, 01/07/2020, 08/10/2020   PNEUMOCOCCAL CONJUGATE-20 06/09/2022   Tdap 02/27/2016   Zoster Recombinat (Shingrix) 03/26/2021, 12/07/2021   Health Maintenance Due  Topic Date Due   HIV Screening  Never done   Hepatitis C Screening  Never done   COVID-19 Vaccine (4 - 2023-24 season) 05/13/2022    Activities of Daily Living   Row Labels 12/12/2022   10:44 AM  In your present state of health, do you have any difficulty performing the following activities:   Section Header. No data exists in this row.   Hearing?   0  Vision?   0  Difficulty concentrating or making decisions?   0  Walking or climbing stairs?   0  Dressing or bathing?   0  Doing errands, shopping?   0  Preparing Food and eating ?   N  Using the Toilet?   N  In the past six months, have you accidently leaked urine?   N  Do you have problems with loss of bowel control?   N  Managing your Medications?  N  Managing your Finances?   N  Housekeeping or managing your Housekeeping?   N    Physical Exam  (optional), or other factors deemed appropriate based on the beneficiary's medical and social history and current clinical standards.  Advanced Directives: Does Patient Have a Medical Advance Directive?: No Would patient like information on creating a medical advance directive?: Yes (Inpatient - patient defers creating a medical advance directive at this time - Information given)    Assessment:    This is a routine wellness examination for this patient .  General: No apparent distress. Eyes: Extraocular eye movements intact, pupils equal and round. Neck: Supple, trachea midline. Thyroid: No enlargement, mobile without  fixation, no tenderness. Cardiovascular: Regular rhythm and rate, no murmur, normal radial pulses. Respiratory: Normal respiratory effort, clear to auscultation. Gastrointestinal: Normal pitch active bowel sounds, nontender abdomen without distention or appreciable hepatomegaly. Musculoskeletal: Normal muscle tone, no tenderness on palpation of tibia, no excessive thoracic kyphosis. Skin: Appropriate warmth, no visible rash. Mental status: Alert, conversant, speech clear, thought logical, appropriate mood and affect, no hallucinations or delusions evident. Hematologic/lymphatic: No cervical adenopathy, no visible ecchymoses.  Vision/Hearing screen Deferred   Dietary issues and exercise activities discussed:      Goals   None   Depression Screen   Row Labels 12/12/2022   10:44 AM 11/09/2022   10:59 AM 08/09/2022   11:17 AM 12/07/2021   10:15 AM  PHQ 2/9 Scores   Section Header. No data exists in this row.      PHQ - 2 Score   0 2 0 2  PHQ- 9 Score    3  5     Fall Risk   Row Labels 12/12/2022   10:44 AM  Fall Risk    Section Header. No data exists in this row.   Falls in the past year?   0  Number falls in past yr:   0  Injury with Fall?   0  Risk for fall due to :   No Fall Risks    Cognitive Function:   Row Labels 12/12/2022   10:45 AM  MMSE - Mini Mental State Exam   Section Header. No data exists in this row.   Orientation to time   5  Orientation to Place   5  Registration   3  Attention/ Calculation   5  Recall   3  Language- name 2 objects   2  Language- repeat   1  Language- follow 3 step command   3  Language- read & follow direction   1  Write a sentence   1  Copy design   1  Total score   30        Patient Care Team: Kerin Perna, NP as PCP - General (Internal Medicine)     Plan:    I have personally reviewed and noted the following in the patient's chart:   Medical and social history Use of alcohol, tobacco or illicit drugs  Current  medications and supplements Functional ability and status Nutritional status Physical activity Advanced directives List of other physicians Hospitalizations, surgeries, and ER visits in previous 12 months Vitals Screenings to include cognitive, depression, and falls Referrals and appointments  In addition, I have reviewed and discussed with patient certain preventive protocols, quality metrics, and best practice recommendations. A written personalized care plan for preventive services as well as general preventive health recommendations were provided to patient.     Sharyn Lull  Harriette Ohara, NP 12/12/2022

## 2023-01-10 ENCOUNTER — Other Ambulatory Visit: Payer: Self-pay | Admitting: Physician Assistant

## 2023-01-10 DIAGNOSIS — F330 Major depressive disorder, recurrent, mild: Secondary | ICD-10-CM

## 2023-01-12 NOTE — Telephone Encounter (Signed)
Will forward to provider  

## 2023-01-26 ENCOUNTER — Other Ambulatory Visit (INDEPENDENT_AMBULATORY_CARE_PROVIDER_SITE_OTHER): Payer: Self-pay | Admitting: Primary Care

## 2023-01-26 DIAGNOSIS — E782 Mixed hyperlipidemia: Secondary | ICD-10-CM

## 2023-02-15 ENCOUNTER — Telehealth (INDEPENDENT_AMBULATORY_CARE_PROVIDER_SITE_OTHER): Payer: Self-pay | Admitting: Primary Care

## 2023-02-15 NOTE — Telephone Encounter (Signed)
Medication Refill - Medication: hydrochlorothiazide (HYDRODIURIL) 25 MG tablet , buPROPion (WELLBUTRIN XL) 150 MG 24 hr tablet , amLODipine (NORVASC) 10 MG tablet , cloNIDine (CATAPRES) 0.1 MG tablet , atorvastatin (LIPITOR) 10 MG tablet , sertraline (ZOLOFT) 100 MG tablet , cetirizine (ZYRTEC ALLERGY) 10 MG tablet   Has the patient contacted their pharmacy? Yes.     Preferred Pharmacy (with phone number or street name):  [id number] [place],  - [address] AT [facility] Phone: [phone]  Fax: [phone]     Has the patient been seen for an appointment in the last year OR does the patient have an upcoming appointment? Yes.    Please assist patient further. She is also going to check with her pharmacy as her my chart shows no refills but there were refills put on all of her prescriptions.

## 2023-02-15 NOTE — Telephone Encounter (Signed)
The patient has made an additional phone call and asks that the request be disregarded

## 2023-06-05 ENCOUNTER — Other Ambulatory Visit (INDEPENDENT_AMBULATORY_CARE_PROVIDER_SITE_OTHER): Payer: Self-pay | Admitting: Primary Care

## 2023-06-05 ENCOUNTER — Telehealth (INDEPENDENT_AMBULATORY_CARE_PROVIDER_SITE_OTHER): Payer: Self-pay | Admitting: Primary Care

## 2023-06-05 DIAGNOSIS — F330 Major depressive disorder, recurrent, mild: Secondary | ICD-10-CM

## 2023-06-05 NOTE — Telephone Encounter (Signed)
Pt is calling in because she has changed her pharmacy to [facility] and says [facility] can't access the prescriptions and they need Marcelino Duster to send over documentation regarding buPROPion (WELLBUTRIN XL) 150 MG 24 hr tablet [[id number]] so they can fill it.

## 2023-06-05 NOTE — Telephone Encounter (Signed)
Will forward to provider  

## 2023-06-09 ENCOUNTER — Other Ambulatory Visit (INDEPENDENT_AMBULATORY_CARE_PROVIDER_SITE_OTHER): Payer: Self-pay

## 2023-06-09 ENCOUNTER — Telehealth (INDEPENDENT_AMBULATORY_CARE_PROVIDER_SITE_OTHER): Payer: Self-pay | Admitting: Primary Care

## 2023-06-09 DIAGNOSIS — F330 Major depressive disorder, recurrent, mild: Secondary | ICD-10-CM

## 2023-06-09 NOTE — Telephone Encounter (Signed)
Reached out to pt to see which [facility] to [facility] pt states she will need to check the address to make sure it will be sent to the correct pharmacy and she will give a call back.

## 2023-06-09 NOTE — Telephone Encounter (Signed)
Pt is calling in to leave the number for [facility] delivery pharmacy. [phone]

## 2023-06-12 ENCOUNTER — Other Ambulatory Visit (INDEPENDENT_AMBULATORY_CARE_PROVIDER_SITE_OTHER): Payer: Self-pay

## 2023-06-12 DIAGNOSIS — F5104 Psychophysiologic insomnia: Secondary | ICD-10-CM

## 2023-06-12 DIAGNOSIS — I10 Essential (primary) hypertension: Secondary | ICD-10-CM

## 2023-06-12 DIAGNOSIS — F330 Major depressive disorder, recurrent, mild: Secondary | ICD-10-CM

## 2023-06-12 MED ORDER — BUPROPION HCL ER (XL) 150 MG PO TB24: 150.0000 mg | ORAL_TABLET | Freq: Every day | ORAL | 1 refills | Status: DC

## 2023-06-12 MED ORDER — SERTRALINE HCL 100 MG PO TABS: ORAL_TABLET | ORAL | 1 refills | Status: DC

## 2023-06-12 MED ORDER — AMLODIPINE BESYLATE 10 MG PO TABS
ORAL_TABLET | ORAL | 1 refills | Status: DC
Start: 2023-06-12 — End: 2023-11-06

## 2023-06-12 MED ORDER — CLONIDINE HCL 0.1 MG PO TABS: 0.1000 mg | ORAL_TABLET | Freq: Every day | ORAL | 1 refills | Status: DC

## 2023-06-12 MED ORDER — ALENDRONATE SODIUM 70 MG PO TABS: 70.0000 mg | ORAL_TABLET | ORAL | 11 refills | Status: DC

## 2023-06-12 NOTE — Telephone Encounter (Signed)
Rx's has already been sent.

## 2023-06-12 NOTE — Telephone Encounter (Signed)
Rx has been sent to [facility].

## 2023-08-16 ENCOUNTER — Telehealth (INDEPENDENT_AMBULATORY_CARE_PROVIDER_SITE_OTHER): Payer: Self-pay

## 2023-08-16 ENCOUNTER — Other Ambulatory Visit (INDEPENDENT_AMBULATORY_CARE_PROVIDER_SITE_OTHER): Payer: Self-pay

## 2023-08-16 ENCOUNTER — Telehealth (INDEPENDENT_AMBULATORY_CARE_PROVIDER_SITE_OTHER): Payer: Self-pay | Admitting: Primary Care

## 2023-08-16 DIAGNOSIS — E782 Mixed hyperlipidemia: Secondary | ICD-10-CM

## 2023-08-16 MED ORDER — ATORVASTATIN CALCIUM 10 MG PO TABS: ORAL_TABLET | ORAL | 0 refills | Status: DC

## 2023-08-16 NOTE — Telephone Encounter (Signed)
Received rx request for Atorvastatin. Sent in a 90 day supply  Please contact pt and schedule an appt. Pt is overdue for an appt

## 2023-08-16 NOTE — Telephone Encounter (Signed)
Called pt to schedule apt. If pt calls back please schedule an apt for pt.

## 2023-08-25 ENCOUNTER — Telehealth (INDEPENDENT_AMBULATORY_CARE_PROVIDER_SITE_OTHER): Payer: Self-pay | Admitting: Primary Care

## 2023-08-25 NOTE — Telephone Encounter (Signed)
Left VM with pt about their upcoming apt.

## 2023-08-28 ENCOUNTER — Ambulatory Visit (INDEPENDENT_AMBULATORY_CARE_PROVIDER_SITE_OTHER): Payer: Medicare Other | Admitting: Primary Care

## 2023-08-28 ENCOUNTER — Encounter (INDEPENDENT_AMBULATORY_CARE_PROVIDER_SITE_OTHER): Payer: Self-pay | Admitting: Primary Care

## 2023-08-28 VITALS — BP 133/84 | HR 64 | Resp 16 | Wt 187.2 lb

## 2023-08-28 DIAGNOSIS — I10 Essential (primary) hypertension: Secondary | ICD-10-CM | POA: Diagnosis not present

## 2023-08-28 DIAGNOSIS — E782 Mixed hyperlipidemia: Secondary | ICD-10-CM | POA: Diagnosis not present

## 2023-08-28 DIAGNOSIS — Z1159 Encounter for screening for other viral diseases: Secondary | ICD-10-CM

## 2023-08-28 MED ORDER — HYDROCHLOROTHIAZIDE 25 MG PO TABS: ORAL_TABLET | ORAL | 1 refills | Status: DC

## 2023-08-28 NOTE — Progress Notes (Signed)
[facility]  Chelsea Walters, is a 67 y.o. female  [id number]  [id number]  DOB - [birth year]  Chief Complaint  Patient presents with   Hypertension       Subjective:   Chelsea Walters is a 67 y.o. female here today for a follow up visit HTN. Patient has No headache, No chest pain, No abdominal pain - No Nausea, No new weakness tingling or numbness, No Cough - shortness of breath  No problems updated.  Comprehensive ROS Pertinent positive and negative noted in HPI   No Known Allergies  Past Medical History:  Diagnosis Date   Depression    High cholesterol    Hypertension     Current Outpatient Medications on File Prior to Visit  Medication Sig Dispense Refill   alendronate (FOSAMAX) 70 MG tablet Take 1 tablet (70 mg total) by mouth every 7 (seven) days. Take with a full glass of water on an empty stomach. 4 tablet 11   amLODipine (NORVASC) 10 MG tablet TAKE 1 TABLET(10 MG) BY MOUTH DAILY 90 tablet 1   atorvastatin (LIPITOR) 10 MG tablet TAKE 1 TABLET(10 MG) BY MOUTH DAILY 90 tablet 0   buPROPion (WELLBUTRIN XL) 150 MG 24 hr tablet Take 1 tablet (150 mg total) by mouth daily. 90 tablet 1   cloNIDine (CATAPRES) 0.1 MG tablet Take 1 tablet (0.1 mg total) by mouth daily. Take 1 tablet at bedtime by mouth 90 tablet 1   hydrochlorothiazide (HYDRODIURIL) 25 MG tablet Take 1 tablet by mouth every morning 90 tablet 1   sertraline (ZOLOFT) 100 MG tablet TAKE 1 TABLET(100 MG) BY MOUTH DAILY 90 tablet 1   No current facility-administered medications on file prior to visit.   Health Maintenance  Topic Date Due   Hepatitis C Screening  Never done   Flu Shot  04/13/2023   COVID-19 Vaccine (4 - 2024-25 season) 05/14/2023   Medicare Annual Wellness Visit  12/12/2023   Mammogram  08/16/2024   DTaP/Tdap/Td vaccine (2 - Td or Tdap) 02/26/2026   Colon Cancer Screening  05/29/2030   Pneumonia Vaccine  Completed   DEXA scan (bone density measurement)  Completed    Zoster (Shingles) Vaccine  Completed   HPV Vaccine  Aged Out    Objective:   Vitals:   08/28/23 1012 08/28/23 1013  BP: (!) 143/79 133/84  Pulse: 64   Resp: 16   SpO2: 98%   Weight: 187 lb 3.2 oz (84.9 kg)    BP Readings from Last 3 Encounters:  08/28/23 133/84  12/12/22 134/81  11/09/22 137/79      Physical Exam Vitals reviewed.  Constitutional:      Appearance: She is obese.  HENT:     Head: Normocephalic.     Right Ear: Tympanic membrane, ear canal and external ear normal.     Left Ear: Tympanic membrane, ear canal and external ear normal.     Nose: Nose normal.     Mouth/Throat:     Mouth: Mucous membranes are moist.  Eyes:     Extraocular Movements: Extraocular movements intact.     Pupils: Pupils are equal, round, and reactive to light.  Cardiovascular:     Rate and Rhythm: Normal rate.  Pulmonary:     Effort: Pulmonary effort is normal.     Breath sounds: Normal breath sounds.  Abdominal:     General: Bowel sounds are normal.     Palpations: Abdomen is soft.  Musculoskeletal:  General: Normal range of motion.     Cervical back: Normal range of motion.  Skin:    General: Skin is warm and dry.  Neurological:     Mental Status: She is alert and oriented to person, place, and time.  Psychiatric:        Mood and Affect: Mood normal.        Behavior: Behavior normal.        Thought Content: Thought content normal.       Assessment & Plan  Makayle was seen today for hypertension.  Diagnoses and all orders for this visit:  Need for hepatitis C screening test -     HCV Ab w Reflex to Quant PCR  Mixed hyperlipidemia -     Lipid panel  Essential hypertension Controlled  Explained that having normal blood pressure is the goal and medications are helping to get to goal and maintain normal blood pressure. DIET: Limit salt intake, read nutrition labels to check salt content, limit fried and high fatty foods  Avoid using multisymptom OTC cold  preparations that generally contain sudafed which can rise BP. Consult with pharmacist on best cold relief products to use for persons with HTN EXERCISE Discussed incorporating exercise such as walking - 30 minutes most days of the week and can do in 10 minute intervals    -     CBC with Differential/Platelet -     CMP14+EGFR -     hydrochlorothiazide (HYDRODIURIL) 25 MG tablet; Take 1 tablet by mouth every morning  Other orders -     Interpretation:    Patient have been counseled extensively about nutrition and exercise. Other issues discussed during this visit include: low cholesterol diet, weight control and daily exercise, foot care, annual eye examinations at Ophthalmology, importance of adherence with medications and regular follow-up. We also discussed long term complications of uncontrolled diabetes and hypertension.   Keep schedule appt  The patient was given clear instructions to go to ER or return to medical center if symptoms don't improve, worsen or new problems develop. The patient verbalized understanding. The patient was told to call to get lab results if they haven't heard anything in the next week.   This note has been created with Education officer, environmental. Any transcriptional errors are unintentional.   Grayce Sessions, NP 08/28/2023, 11:02 AM

## 2023-08-29 LAB — LIPID PANEL

## 2023-08-31 LAB — CBC WITH DIFFERENTIAL/PLATELET
Basophils Absolute: 0 10*3/uL (ref 0.0–0.2)
Basos: 0 %
EOS (ABSOLUTE): 0.1 10*3/uL (ref 0.0–0.4)
Eos: 3 %
Hematocrit: 42.9 % (ref 34.0–46.6)
Hemoglobin: 13.8 g/dL (ref 11.1–15.9)
Immature Grans (Abs): 0 10*3/uL (ref 0.0–0.1)
Immature Granulocytes: 1 %
Lymphocytes Absolute: 2.2 10*3/uL (ref 0.7–3.1)
Lymphs: 40 %
MCH: 29.3 pg (ref 26.6–33.0)
MCHC: 32.2 g/dL (ref 31.5–35.7)
MCV: 91 fL (ref 79–97)
Monocytes Absolute: 0.4 10*3/uL (ref 0.1–0.9)
Monocytes: 7 %
Neutrophils Absolute: 2.7 10*3/uL (ref 1.4–7.0)
Neutrophils: 49 %
Platelets: 229 10*3/uL (ref 150–450)
RBC: 4.71 x10E6/uL (ref 3.77–5.28)
RDW: 11.9 % (ref 11.7–15.4)
WBC: 5.4 10*3/uL (ref 3.4–10.8)

## 2023-08-31 LAB — CMP14+EGFR
ALT: 13 IU/L (ref 0–32)
AST: 14 IU/L (ref 0–40)
Albumin: 4.4 g/dL (ref 3.9–4.9)
Alkaline Phosphatase: 47 IU/L (ref 44–121)
BUN/Creatinine Ratio: 16 (ref 12–28)
BUN: 13 mg/dL (ref 8–27)
Bilirubin Total: 0.4 mg/dL (ref 0.0–1.2)
CO2: 22 mmol/L (ref 20–29)
Calcium: 9.6 mg/dL (ref 8.7–10.3)
Chloride: 103 mmol/L (ref 96–106)
Creatinine, Ser: 0.81 mg/dL (ref 0.57–1.00)
Globulin, Total: 3.3 g/dL (ref 1.5–4.5)
Glucose: 85 mg/dL (ref 70–99)
Potassium: 5.2 mmol/L (ref 3.5–5.2)
Sodium: 144 mmol/L (ref 134–144)
Total Protein: 7.7 g/dL (ref 6.0–8.5)
eGFR: 80 mL/min/{1.73_m2} (ref >59)

## 2023-08-31 LAB — LIPID PANEL
Cholesterol, Total: 155 mg/dL (ref 100–199)
HDL: 44 mg/dL (ref >39)
LDL CALC COMMENT:: 3.5 ratio (ref 0.0–4.4)
LDL Chol Calc (NIH): 97 mg/dL (ref 0–99)
Triglycerides: 74 mg/dL (ref 0–149)
VLDL Cholesterol Cal: 14 mg/dL (ref 5–40)

## 2023-08-31 LAB — HCV AB W REFLEX TO QUANT PCR: HCV Ab: NONREACTIVE

## 2023-08-31 LAB — HCV INTERPRETATION

## 2023-11-06 ENCOUNTER — Other Ambulatory Visit (INDEPENDENT_AMBULATORY_CARE_PROVIDER_SITE_OTHER): Payer: Self-pay | Admitting: Primary Care

## 2023-11-06 ENCOUNTER — Other Ambulatory Visit: Payer: Self-pay | Admitting: Physician Assistant

## 2023-11-06 DIAGNOSIS — F330 Major depressive disorder, recurrent, mild: Secondary | ICD-10-CM

## 2023-11-06 DIAGNOSIS — I10 Essential (primary) hypertension: Secondary | ICD-10-CM

## 2023-11-06 DIAGNOSIS — E782 Mixed hyperlipidemia: Secondary | ICD-10-CM

## 2023-11-06 NOTE — Telephone Encounter (Signed)
 Will forward to provider

## 2024-02-05 ENCOUNTER — Other Ambulatory Visit: Payer: Self-pay | Admitting: Medical Genetics

## 2024-02-07 ENCOUNTER — Telehealth (INDEPENDENT_AMBULATORY_CARE_PROVIDER_SITE_OTHER): Payer: Self-pay | Admitting: Primary Care

## 2024-02-07 NOTE — Telephone Encounter (Signed)
 Spoke to pt about upcoming appt.. Will be present

## 2024-02-08 ENCOUNTER — Ambulatory Visit (INDEPENDENT_AMBULATORY_CARE_PROVIDER_SITE_OTHER): Admitting: Primary Care

## 2024-02-08 ENCOUNTER — Other Ambulatory Visit

## 2024-02-08 ENCOUNTER — Encounter (INDEPENDENT_AMBULATORY_CARE_PROVIDER_SITE_OTHER): Payer: Self-pay | Admitting: Primary Care

## 2024-02-08 VITALS — BP 136/76 | HR 61 | Resp 16 | Ht 62.0 in | Wt 187.2 lb

## 2024-02-08 DIAGNOSIS — E782 Mixed hyperlipidemia: Secondary | ICD-10-CM | POA: Diagnosis not present

## 2024-02-08 DIAGNOSIS — I10 Essential (primary) hypertension: Secondary | ICD-10-CM

## 2024-02-08 DIAGNOSIS — F330 Major depressive disorder, recurrent, mild: Secondary | ICD-10-CM | POA: Diagnosis not present

## 2024-02-08 DIAGNOSIS — M25531 Pain in right wrist: Secondary | ICD-10-CM

## 2024-02-08 DIAGNOSIS — G8929 Other chronic pain: Secondary | ICD-10-CM

## 2024-02-08 DIAGNOSIS — M25561 Pain in right knee: Secondary | ICD-10-CM

## 2024-02-08 MED ORDER — AMLODIPINE BESYLATE 10 MG PO TABS: ORAL_TABLET | ORAL | 1 refills | Status: DC

## 2024-02-08 MED ORDER — SERTRALINE HCL 100 MG PO TABS: ORAL_TABLET | ORAL | 1 refills | Status: DC

## 2024-02-08 MED ORDER — HYDROCHLOROTHIAZIDE 25 MG PO TABS: 25.0000 mg | ORAL_TABLET | Freq: Every morning | ORAL | 1 refills | Status: DC

## 2024-02-08 NOTE — Progress Notes (Signed)
 [facility]  [name], is a 68 y.o. female  WGN:[id number]  HQI:[id number]  DOB - 1955/12/16  Chief Complaint  Patient presents with   Hyperlipidemia   Hypertension   Knee Pain    B/l  Arthritis Bone on bone     Hand Pain    Right        Subjective:   Chelsea Walters is a 68 y.o. female here today for a follow  hypertension-well-controlled. Patient has No headache, No chest pain, No abdominal pain - No Nausea, No new weakness tingling or numbness, No Cough - shortness of breath Depression/anxiety-was not able to receive Wellbutrin  informed today she was not taking it due to insurance.  Zoloft  is helping but not as well as it should be She also has concerns with hand pain right and knee pain bilateral-she does have the diagnosis of osteoporosis-she was unable to tolerate Fosamax .  Therefore she has purchase calcium  plus vitamin D and take daily.  4/10 reaches 10/10 with knees.  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  She does have to take ibuprofen when the pain reaches 10 out of 10 and at this time it is being managed with that.    No problems updated.  Comprehensive ROS Pertinent positive and negative noted in HPI   No Known Allergies  Past Medical History:  Diagnosis Date   Depression    High cholesterol    Hypertension     Current Outpatient Medications on File Prior to Visit  Medication Sig Dispense Refill   atorvastatin  (LIPITOR) 10 MG tablet TAKE 1 TABLET(10 MG) BY MOUTH DAILY 90 tablet 0   cloNIDine  (CATAPRES ) 0.1 MG tablet Take 1 tablet (0.1 mg total) by mouth daily. Take 1 tablet at bedtime by mouth 90 tablet 1   No current facility-administered medications on file prior to visit.   Health Maintenance  Topic Date Due   COVID-19 Vaccine (4 - 2024-25 season) 05/14/2023   Medicare Annual Wellness Visit  12/12/2023   Flu Shot  04/12/2024   Mammogram  08/16/2024    DTaP/Tdap/Td vaccine (2 - Td or Tdap) 02/26/2026   Colon Cancer Screening  05/29/2030   Pneumonia Vaccine  Completed   DEXA scan (bone density measurement)  Completed   Hepatitis C Screening  Completed   Zoster (Shingles) Vaccine  Completed   HPV Vaccine  Aged Out   Meningitis B Vaccine  Aged Out    Objective:   Vitals:   02/08/24 1052  BP: 136/76  Pulse: 61  Resp: 16  SpO2: 98%  Weight: 187 lb 3.2 oz (84.9 kg)  Height: 5\' 2"  (1.575 m)   BP Readings from Last 3 Encounters:  02/08/24 136/76  08/28/23 133/84  12/12/22 134/81      Physical Exam Vitals reviewed.  Constitutional:      Appearance: Normal appearance. She is obese.  HENT:     Head: Normocephalic.     Right Ear: Tympanic membrane, ear canal and external ear normal.     Left Ear: Tympanic membrane, ear canal and external ear normal.     Nose: Nose normal.     Mouth/Throat:     Mouth: Mucous membranes are moist.  Eyes:     Extraocular Movements: Extraocular movements intact.     Pupils: Pupils are equal, round, and reactive to light.  Cardiovascular:     Rate and Rhythm: Normal rate.  Pulmonary:     Effort: Pulmonary effort is normal.  Breath sounds: Normal breath sounds.  Abdominal:     General: Bowel sounds are normal.     Palpations: Abdomen is soft.  Musculoskeletal:        General: Normal range of motion.     Cervical back: Normal range of motion.  Skin:    General: Skin is warm and dry.  Neurological:     Mental Status: She is alert and oriented to person, place, and time.  Psychiatric:        Mood and Affect: Mood normal.        Behavior: Behavior normal.        Thought Content: Thought content normal.     Assessment & Plan  Lauralei was seen today for hyperlipidemia, hypertension, knee pain and hand pain.  Diagnoses and all orders for this visit:  Essential hypertension. DIET: Limit salt intake, read nutrition labels to check salt content, limit fried and high fatty foods  Avoid  using multisymptom OTC cold preparations that generally contain sudafed which can rise BP. Consult with pharmacist on best cold relief products to use for persons with HTN EXERCISE Discussed incorporating exercise such as walking - 30 minutes most days of the week and can do in 10 minute intervals    -     CBC with Differential/Platelet; Future -     CMP14+EGFR; Future -     hydrochlorothiazide  (HYDRODIURIL ) 25 MG tablet; Take 1 tablet (25 mg total) by mouth every morning. -     amLODipine  (NORVASC ) 10 MG tablet; TAKE 1 TABLET(10 MG) BY MOUTH DAILY  Mixed hyperlipidemia -     Lipid panel; Future  Mild episode of recurrent major depressive disorder (HCC) -     sertraline  (ZOLOFT ) 100 MG tablet; TAKE 1 TABLET(100 MG) BY MOUTH DAILY Sample of Vraylar 1.5 given for 7 days if patient has no side effects or problems with medication will prescribe medication and another 7-day sample   Patient have been counseled extensively about nutrition and exercise. Other issues discussed during this visit include: low cholesterol diet, weight control and daily exercise, foot care, annual eye examinations at Ophthalmology, importance of adherence with medications and regular follow-up. We also discussed long term complications of uncontrolled diabetes and hypertension.   Return in about 6 months (around 08/10/2024).  The patient was given clear instructions to go to ER or return to medical center if symptoms don't improve, worsen or new problems develop. The patient verbalized understanding. The patient was told to call to get lab results if they haven't heard anything in the next week.   This note has been created with Education officer, environmental. Any transcriptional errors are unintentional.   Marius Siemens, NP 02/08/2024, 11:29 AM

## 2024-02-08 NOTE — Patient Instructions (Signed)
 Cariprazine Capsules What is this medication? CARIPRAZINE (car i PRA zeen) treats schizophrenia and bipolar disorder. It may also be used with antidepressant medication to treat depression. It works by balancing the levels of dopamine and serotonin in your brain, substances that help regulate mood, behaviors, and thoughts. It belongs to a group of medications called antipsychotics. Antipsychotic medications can be used to treat several kinds of mental health conditions. This medicine may be used for other purposes; ask your health care provider or pharmacist if you have questions. COMMON BRAND NAME(S): VRAYLAR What should I tell my care team before I take this medication? They need to know if you have any of these conditions: Dementia Diabetes Have trouble controlling your muscles Heart disease High cholesterol History of breast cancer History of stroke Kidney disease Liver disease Low blood cell levels (white cells, red cells, and platelets) Low blood pressure Parkinson disease Seizures Suicidal thoughts, plans, or attempt by you or a family member Trouble swallowing An unusual or allergic reaction to cariprazine, other medications, foods, dyes, or preservatives Pregnant or trying to get pregnant Breastfeeding How should I use this medication? Take this medication by mouth with a glass of water. Follow the directions on the prescription label. You may take it with or without food. Take your medication at regular intervals. Do not take it more often than directed. Do not stop taking except on your care team's advice. A special MedGuide will be given to you by the pharmacist with each prescription and refill. Be sure to read this information carefully each time. Talk to your care team about the use of this medication in children. Special care may be needed. Overdosage: If you think you have taken too much of this medicine contact a poison control center or emergency room at once. NOTE:  This medicine is only for you. Do not share this medicine with others. What if I miss a dose? If you miss a dose, take it as soon as you can. If it is almost time for your next dose, take only that dose. Do not take double or extra doses. What may interact with this medication? Do not take this medication with any of the following: Metoclopramide This medication may also interact with the following: Antihistamines for allergy, cough, and cold Carbamazepine Certain medications for anxiety or sleep Certain medications for depression, such as amitriptyline, fluoxetine, sertraline Certain medications for fungal infections, such as itraconazole, ketoconazole Certain medications for Parkinson disease, such as levodopa General anesthetics, such as halothane, isoflurane, methoxyflurane, propofol Medications for blood pressure Medications for seizures Medications that relax muscles for surgery Opioid medications for pain Phenothiazines, such as chlorpromazine, prochlorperazine, thioridazine Rifampin This list may not describe all possible interactions. Give your health care provider a list of all the medicines, herbs, non-prescription drugs, or dietary supplements you use. Also tell them if you smoke, drink alcohol, or use illegal drugs. Some items may interact with your medicine. What should I watch for while using this medication? Visit your care team for regular checks on your progress. Tell your care team if your symptoms do not start to get better or if they get worse. Do not suddenly stop taking this medication. You may develop a severe reaction. Your care team will tell you how much medication to take. If your care team wants you to stop the medication, the dose may be slowly lowered over time to avoid any side effects. This medication may cause thoughts of suicide or depression. This includes sudden changes in  mood, behaviors, or thoughts. These changes can happen at any time but are more  common in the beginning of treatment or after a change in dose. Call your care team right away if you experience these thoughts or worsening depression. This medication may affect your coordination, reaction time, or judgment. Do not drive or operate machinery until you know how this medication affects you. Sit up or stand slowly to reduce the risk of dizzy or fainting spells. Drinking alcohol with this medication can increase the risk of these side effects. This medication may cause dry eyes and blurred vision. If you wear contact lenses, you may feel some discomfort. Lubricating eye drops may help. See your care team if the problem does not go away or is severe. This medication may increase blood sugar. The risk may be higher in patients who already have diabetes. Ask your care team what you can do to lower your risk of diabetes while taking this medication. This medication can cause problems with controlling your body temperature. It can lower the response of your body to cold temperatures. If possible, stay indoors during cold weather. If you must go outdoors, wear warm clothes. It can also lower the response of your body to heat. Do not overheat. Do not over-exercise. Stay out of the sun when possible. If you must be in the sun, wear cool clothing. Drink plenty of water. If you have trouble controlling your body temperature, call your care team right away. Talk to your care team if you wish to become pregnant or think you may be pregnant. It is not known if this medication causes birth defects. A registry is available to monitor pregnancy outcomes in those taking this medication or similar medications. Talk to your care team or pharmacist for more information. What side effects may I notice from receiving this medication? Side effects that you should report to your care team as soon as possible: Allergic reactions--skin rash, itching, hives, swelling of the face, lips, tongue, or throat High blood  sugar (hyperglycemia)--increased thirst or amount of urine, unusual weakness or fatigue, blurry vision High fever, stiff muscles, increased sweating, fast or irregular heartbeat, and confusion, which may be signs of neuroleptic malignant syndrome Infection--fever, chills, cough, or sore throat Low blood pressure--dizziness, feeling faint or lightheaded, blurry vision Pain or trouble swallowing Seizures Stroke--sudden numbness or weakness of the face, arm, or leg, trouble speaking, confusion, trouble walking, loss of balance or coordination, dizziness, severe headache, change in vision Thoughts of suicide or self-harm, worsening mood, feelings of depression Uncontrolled and repetitive body movements, muscle stiffness or spasms, tremors or shaking, loss of balance or coordination, restlessness, shuffling walk, which may be signs of extrapyramidal symptoms (EPS) Side effects that usually do not require medical attention (report to your care team if they continue or are bothersome): Constipation Dizziness Drowsiness Nausea Trouble sleeping Upset stomach Vomiting This list may not describe all possible side effects. Call your doctor for medical advice about side effects. You may report side effects to FDA at 1-800-FDA-1088. Where should I keep my medication? Keep out of the reach of children. Store at room temperature between 15 and 30 degrees C (59 and 86 degrees F). Protect from light. Throw away any unused medication after the expiration date. NOTE: This sheet is a summary. It may not cover all possible information. If you have questions about this medicine, talk to your doctor, pharmacist, or health care provider.  2024 Elsevier/Gold Standard (2023-03-08 00:00:00)

## 2024-02-09 ENCOUNTER — Other Ambulatory Visit (HOSPITAL_COMMUNITY)
Admission: RE | Admit: 2024-02-09 | Discharge: 2024-02-09 | Disposition: A | Discharge status: Home / Self Care | Payer: Self-pay | Source: Ambulatory Visit | Attending: Medical Genetics | Admitting: Medical Genetics

## 2024-02-20 LAB — GENECONNECT MOLECULAR SCREEN: Genetic Analysis Overall Interpretation: NEGATIVE

## 2024-02-26 ENCOUNTER — Other Ambulatory Visit (INDEPENDENT_AMBULATORY_CARE_PROVIDER_SITE_OTHER)

## 2024-02-26 DIAGNOSIS — E782 Mixed hyperlipidemia: Secondary | ICD-10-CM

## 2024-02-26 DIAGNOSIS — I10 Essential (primary) hypertension: Secondary | ICD-10-CM

## 2024-02-26 NOTE — Progress Notes (Signed)
 Pt came into the office for lab work

## 2024-02-27 LAB — CBC WITH DIFFERENTIAL/PLATELET
Basophils Absolute: 0 10*3/uL (ref 0.0–0.2)
Basos: 0 %
EOS (ABSOLUTE): 0.2 10*3/uL (ref 0.0–0.4)
Eos: 4 %
Hematocrit: 44.5 % (ref 34.0–46.6)
Hemoglobin: 13.8 g/dL (ref 11.1–15.9)
Immature Grans (Abs): 0 10*3/uL (ref 0.0–0.1)
Immature Granulocytes: 0 %
Lymphocytes Absolute: 2.6 10*3/uL (ref 0.7–3.1)
Lymphs: 46 %
MCH: 28.8 pg (ref 26.6–33.0)
MCHC: 31 g/dL — ABNORMAL LOW (ref 31.5–35.7)
MCV: 93 fL (ref 79–97)
Monocytes Absolute: 0.4 10*3/uL (ref 0.1–0.9)
Monocytes: 7 %
Neutrophils Absolute: 2.5 10*3/uL (ref 1.4–7.0)
Neutrophils: 43 %
Platelets: 207 10*3/uL (ref 150–450)
RBC: 4.8 x10E6/uL (ref 3.77–5.28)
RDW: 12.2 % (ref 11.7–15.4)
WBC: 5.7 10*3/uL (ref 3.4–10.8)

## 2024-02-27 LAB — CMP14+EGFR
ALT: 16 IU/L (ref 0–32)
AST: 15 IU/L (ref 0–40)
Albumin: 4.4 g/dL (ref 3.9–4.9)
Alkaline Phosphatase: 51 IU/L (ref 44–121)
BUN/Creatinine Ratio: 24 (ref 12–28)
BUN: 16 mg/dL (ref 8–27)
Bilirubin Total: 0.3 mg/dL (ref 0.0–1.2)
CO2: 22 mmol/L (ref 20–29)
Calcium: 9.5 mg/dL (ref 8.7–10.3)
Chloride: 101 mmol/L (ref 96–106)
Creatinine, Ser: 0.68 mg/dL (ref 0.57–1.00)
Globulin, Total: 3.1 g/dL (ref 1.5–4.5)
Glucose: 74 mg/dL (ref 70–99)
Potassium: 4.3 mmol/L (ref 3.5–5.2)
Sodium: 140 mmol/L (ref 134–144)
Total Protein: 7.5 g/dL (ref 6.0–8.5)
eGFR: 96 mL/min/{1.73_m2} (ref >59)

## 2024-02-27 LAB — LIPID PANEL
Chol/HDL Ratio: 3.9 ratio (ref 0.0–4.4)
Cholesterol, Total: 178 mg/dL (ref 100–199)
HDL: 46 mg/dL (ref >39)
LDL Chol Calc (NIH): 115 mg/dL — ABNORMAL HIGH (ref 0–99)
Triglycerides: 95 mg/dL (ref 0–149)
VLDL Cholesterol Cal: 17 mg/dL (ref 5–40)

## 2024-03-04 ENCOUNTER — Telehealth (INDEPENDENT_AMBULATORY_CARE_PROVIDER_SITE_OTHER): Payer: Self-pay

## 2024-03-04 ENCOUNTER — Other Ambulatory Visit (INDEPENDENT_AMBULATORY_CARE_PROVIDER_SITE_OTHER): Payer: Self-pay | Admitting: Primary Care

## 2024-03-04 DIAGNOSIS — E782 Mixed hyperlipidemia: Secondary | ICD-10-CM

## 2024-03-04 MED ORDER — ATORVASTATIN CALCIUM 10 MG PO TABS: ORAL_TABLET | ORAL | 1 refills | Status: DC

## 2024-03-04 NOTE — Telephone Encounter (Signed)
 Copied from CRM [id number]. Topic: Clinical - Lab/Test Results >> Mar 04, 2024  8:48 AM Tobias CROME wrote: Reason for CRM: Pt calling to check on results, pt received them via mychart. Pt inquiring if she would need her atorvastatin  refilled. Pt also inquiring about Vraylar rx.   Please reach out to patient w/ results when they're available.

## 2024-03-20 ENCOUNTER — Ambulatory Visit (INDEPENDENT_AMBULATORY_CARE_PROVIDER_SITE_OTHER): Admitting: Orthopaedic Surgery

## 2024-03-20 DIAGNOSIS — M25562 Pain in left knee: Secondary | ICD-10-CM

## 2024-03-20 DIAGNOSIS — M25561 Pain in right knee: Secondary | ICD-10-CM | POA: Diagnosis not present

## 2024-03-20 DIAGNOSIS — M170 Bilateral primary osteoarthritis of knee: Secondary | ICD-10-CM | POA: Diagnosis not present

## 2024-03-20 DIAGNOSIS — G8929 Other chronic pain: Secondary | ICD-10-CM

## 2024-03-20 DIAGNOSIS — M1711 Unilateral primary osteoarthritis, right knee: Secondary | ICD-10-CM

## 2024-03-20 DIAGNOSIS — M1712 Unilateral primary osteoarthritis, left knee: Secondary | ICD-10-CM

## 2024-03-20 MED ORDER — HYALURONAN 88 MG/4ML IX SOSY
88.0000 mg | PREFILLED_SYRINGE | INTRA_ARTICULAR | Status: AC | PRN
  Administered 2024-03-20: 88 mg via INTRA_ARTICULAR

## 2024-03-20 NOTE — Progress Notes (Signed)
 The patient is a 68 year old female with chronic bilateral knee pain.  She was actually seen by one of our partners in the office back in 2022 and x-rays showed moderate arthritis of her knees.  She does have pain with walking at times.  She does report that she had gel injections in her knees back in 2018 and they lasted for over a year.  She has not remonstrated any type of surgery yet.  She would like to consider hyaluronic acid injections in her knees again.  She has had no acute change in her medical status.  I was able to review past medical history and medications within epic.  She says 1 knee does not hurt worse than the other and they can bother her at different times in different ways.  She has a harder time getting up and down and going up and down stairs.  She denies any injury.  She denies any significant swelling.  Examination of both knees show some patellofemoral pain and crepitation.  There is slight varus malalignment of both knees that are correctable.  Both knees are ligamentously stable with good range of motion but are painful.  Neither knee has an effusion.  We did not x-ray her knees today.  She is a excellent candidate for hyaluronic acid for her knees to treat the pain from osteoarthritis and since she has had these before she wanted to have them again today.  The risk and benefits of these type of injections were explained in detail and I do feel that she is a good candidate for these injections as well.  We did place Monovisc in both knees today that she tolerated well.  Follow-up is as needed.  She knows to wait at least a minimum of 6 months between these injections.  I also did give her handout on the strengthening exercises.  Lot #[id number]    Procedure Note  Patient: Chelsea Walters             Date of Birth: 1955/12/02           MRN: [id number]             Visit Date: 03/20/2024  Procedures: Visit Diagnoses:  1. Unilateral primary osteoarthritis, right knee   2.  Unilateral primary osteoarthritis, left knee   3. Chronic pain of right knee   4. Chronic pain of left knee     Large Joint Inj: R knee on 03/20/2024 10:24 AM Indications: diagnostic evaluation and pain Details: 22 G 1.5 in needle, superolateral approach  Arthrogram: No  Medications: 88 mg Hyaluronan 88 MG/4ML Outcome: tolerated well, no immediate complications Procedure, treatment alternatives, risks and benefits explained, specific risks discussed. Consent was given by the patient. Immediately prior to procedure a time out was called to verify the correct patient, procedure, equipment, support staff and site/side marked as required. Patient was prepped and draped in the usual sterile fashion.    Large Joint Inj: L knee on 03/20/2024 10:24 AM Indications: diagnostic evaluation and pain Details: 22 G 1.5 in needle, superolateral approach  Arthrogram: No  Medications: 88 mg Hyaluronan 88 MG/4ML Outcome: tolerated well, no immediate complications Procedure, treatment alternatives, risks and benefits explained, specific risks discussed. Consent was given by the patient. Immediately prior to procedure a time out was called to verify the correct patient, procedure, equipment, support staff and site/side marked as required. Patient was prepped and draped in the usual sterile fashion.

## 2024-04-08 ENCOUNTER — Other Ambulatory Visit: Payer: Self-pay | Admitting: Primary Care

## 2024-04-08 DIAGNOSIS — Z1231 Encounter for screening mammogram for malignant neoplasm of breast: Secondary | ICD-10-CM

## 2024-04-17 ENCOUNTER — Encounter

## 2024-05-01 ENCOUNTER — Ambulatory Visit

## 2024-05-14 ENCOUNTER — Ambulatory Visit

## 2024-05-15 ENCOUNTER — Ambulatory Visit: Attending: Primary Care

## 2024-05-15 VITALS — Ht 62.0 in | Wt 180.0 lb

## 2024-05-15 DIAGNOSIS — Z0000 Encounter for general adult medical examination without abnormal findings: Secondary | ICD-10-CM

## 2024-05-15 NOTE — Patient Instructions (Signed)
 Ms. Chelsea Walters , Thank you for taking time out of your busy schedule to complete your Annual Wellness Visit with me. I enjoyed our conversation and look forward to speaking with you again next year. I, as well as your care team,  appreciate your ongoing commitment to your health goals. Please review the following plan we discussed and let me know if I can assist you in the future. Your Game plan/ To Do List    Referrals: If you haven't heard from the office you've been referred to, please reach out to them at the phone provided.   Follow up Visits: We will see or speak with you next year for your Next Medicare AWV with our clinical staff Have you seen your provider in the last 6 months (3 months if uncontrolled diabetes)? Yes  Clinician Recommendations:  Aim for 30 minutes of exercise or brisk walking, 6-8 glasses of water, and 5 servings of fruits and vegetables each day.       This is a list of the screenings recommended for you:  Health Maintenance  Topic Date Due   Flu Shot  04/12/2024   Mammogram  08/16/2024   Medicare Annual Wellness Visit  05/15/2025   DTaP/Tdap/Td vaccine (2 - Td or Tdap) 02/26/2026   Colon Cancer Screening  05/29/2030   Pneumococcal Vaccine for age over 22  Completed   DEXA scan (bone density measurement)  Completed   COVID-19 Vaccine  Completed   Hepatitis C Screening  Completed   Zoster (Shingles) Vaccine  Completed   HPV Vaccine  Aged Out   Meningitis B Vaccine  Aged Out   Hepatitis B Vaccine  Discontinued    Advanced directives: (Declined) Advance directive discussed with you today. Even though you declined this today, please call our office should you change your mind, and we can give you the proper paperwork for you to fill out. Advance Care Planning is important because it:  [x]  Makes sure you receive the medical care that is consistent with your values, goals, and preferences  [x]  It provides guidance to your family and loved ones and reduces their  decisional burden about whether or not they are making the right decisions based on your wishes.  Follow the link provided in your after visit summary or read over the paperwork we have mailed to you to help you started getting your Advance Directives in place. If you need assistance in completing these, please reach out to us  so that we can help you!  See attachments for Preventive Care and Fall Prevention Tips.

## 2024-05-15 NOTE — Progress Notes (Cosign Needed Addendum)
 Because this visit was a virtual/telehealth visit,  certain criteria was not obtained, such a blood pressure, CBG if applicable, and timed get up and go. Any medications not marked as taking were not mentioned during the medication reconciliation part of the visit. Any vitals not documented were not able to be obtained due to this being a telehealth visit or patient was unable to self-report a recent blood pressure reading due to a lack of equipment at home via telehealth. Vitals that have been documented are verbally provided by the patient.   Subjective:   Chelsea Walters is a 68 y.o. who presents for a Medicare Wellness preventive visit.  As a reminder, Annual Wellness Visits don't include a physical exam, and some assessments may be limited, especially if this visit is performed virtually. We may recommend an in-person follow-up visit with your provider if needed.  Visit Complete: Virtual I connected with  Chelsea Walters Patient on 05/15/24 by a audio enabled telemedicine application and verified that I am speaking with the correct person using two identifiers.  Patient Location: Home  Provider Location: Office/Clinic  I discussed the limitations of evaluation and management by telemedicine. The patient expressed understanding and agreed to proceed.  Vital Signs: Because this visit was a virtual/telehealth visit, some criteria may be missing or patient reported. Any vitals not documented were not able to be obtained and vitals that have been documented are patient reported.  VideoDeclined- This patient declined Librarian, academic. Therefore the visit was completed with audio only.  Persons Participating in Visit: Patient.  AWV Questionnaire: No: Patient Medicare AWV questionnaire was not completed prior to this visit.  Cardiac Risk Factors include: advanced age (>20men, >64 women);hypertension;dyslipidemia;obesity (BMI >30kg/m2);sedentary lifestyle      Objective:    Today's Vitals   05/15/24 0930  Weight: 180 lb (81.6 kg)  Height: 5' 2 (1.575 m)  PainSc: 0-No pain   Body mass index is 32.92 kg/m.     05/15/2024    9:33 AM 12/12/2022   10:44 AM  Advanced Directives  Does Patient Have a Medical Advance Directive? No No  Would patient like information on creating a medical advance directive? No - Patient declined Yes (Inpatient - patient defers creating a medical advance directive at this time - Information given)    Current Medications (verified) Outpatient Encounter Medications as of 05/15/2024  Medication Sig   amLODipine  (NORVASC ) 10 MG tablet TAKE 1 TABLET(10 MG) BY MOUTH DAILY   atorvastatin  (LIPITOR) 10 MG tablet TAKE 1 TABLET(10 MG) BY MOUTH DAILY   hydrochlorothiazide  (HYDRODIURIL ) 25 MG tablet Take 1 tablet (25 mg total) by mouth every morning.   sertraline  (ZOLOFT ) 100 MG tablet TAKE 1 TABLET(100 MG) BY MOUTH DAILY   No facility-administered encounter medications on file as of 05/15/2024.    Allergies (verified) Patient has no known allergies.   History: Past Medical History:  Diagnosis Date   Depression    High cholesterol    Hypertension    Past Surgical History:  Procedure Laterality Date   HAND SURGERY Left    History reviewed. No pertinent family history. Social History   Socioeconomic History   Marital status: Widowed    Spouse name: Not on file   Number of children: Not on file   Years of education: Not on file   Highest education level: Bachelor's degree (e.g., BA, AB, BS)  Occupational History   Not on file  Tobacco Use   Smoking status: Never  Smokeless tobacco: Never  Substance and Sexual Activity   Alcohol use: Never   Drug use: Never   Sexual activity: Not Currently  Other Topics Concern   Not on file  Social History Narrative   Not on file   Social Drivers of Health   Financial Resource Strain: Low Risk  (05/15/2024)   Overall Financial Resource Strain (CARDIA)    Difficulty of  Paying Living Expenses: Not hard at all  Food Insecurity: No Food Insecurity (05/15/2024)   Hunger Vital Sign    Worried About Running Out of Food in the Last Year: Never true    Ran Out of Food in the Last Year: Never true  Transportation Needs: No Transportation Needs (05/15/2024)   PRAPARE - Administrator, Civil Service (Medical): No    Lack of Transportation (Non-Medical): No  Physical Activity: Inactive (05/15/2024)   Exercise Vital Sign    Days of Exercise per Week: 0 days    Minutes of Exercise per Session: 0 min  Stress: No Stress Concern Present (05/15/2024)   [employer] of Occupational Health - Occupational Stress Questionnaire    Feeling of Stress: Not at all  Social Connections: Moderately Isolated (05/15/2024)   Social Connection and Isolation Panel    Frequency of Communication with Friends and Family: More than three times a week    Frequency of Social Gatherings with Friends and Family: Once a week    Attends Religious Services: 1 to 4 times per year    Active Member of [employer] or Organizations: No    Attends Banker Meetings: Not on file    Marital Status: Widowed    Tobacco Counseling Counseling given: Not Answered    Clinical Intake:  Pre-visit preparation completed: Yes  Pain : No/denies pain Pain Score: 0-No pain     BMI - recorded: 32.92 Nutritional Status: BMI > 30  Obese Nutritional Risks: None Diabetes: No  Lab Results  Component Value Date   HGBA1C 5.5 12/07/2021     How often do you need to have someone help you when you read instructions, pamphlets, or other written materials from your doctor or pharmacy?: 1 - Never What is the last grade level you completed in school?: [employer]  Interpreter Needed?: No  Information entered by :: Chelsea BROCKS Silvestre [place]ines, LPN.   Activities of Daily Living     05/15/2024    9:33 AM  In your present state of health, do you have any difficulty performing the following  activities:  Hearing? 0  Vision? 0  Difficulty concentrating or making decisions? 0  Comment BSE: READING  Walking or climbing stairs? 0  Dressing or bathing? 0  Doing errands, shopping? 0  Preparing Food and eating ? N  Using the Toilet? N  In the past six months, have you accidently leaked urine? N  Do you have problems with loss of bowel control? N  Managing your Medications? N  Managing your Finances? N  Housekeeping or managing your Housekeeping? N    Patient Care Team: Celestia Rosaline SQUIBB, NP as PCP - General (Internal Medicine) [place], My [place], [place] as Referring Physician (Optometry)  I have updated your Care Teams any recent Medical Services you may have received from other providers in the past year.     Assessment:   This is a routine wellness examination for Sh[place]an.  Hearing/Vision screen Hearing Screening - Comments:: Patient has adequate hearing. Vision Screening - Comments:: Patient wears otc readers.  Patient  up to date with yearly eye exam completed by My Phebe Daniels, OD.   Goals Addressed             This Visit's Progress    05/15/2024: My goal os to be more physically active.         Depression Screen     05/15/2024    9:34 AM 02/08/2024   10:50 AM 08/28/2023   10:11 AM 12/12/2022   10:44 AM 11/09/2022   10:59 AM 08/09/2022   11:17 AM 12/07/2021   10:15 AM  PHQ 2/9 Scores  PHQ - 2 Score 0 0 1 0 2 0 2  PHQ- 9 Score 0    3  5    Fall Risk     05/15/2024    9:33 AM 02/08/2024   10:48 AM 08/28/2023   10:11 AM 12/12/2022   10:44 AM 11/09/2022   10:59 AM  Fall Risk   Falls in the past year? 0 0 0 0 0  Number falls in past yr: 0 0 0 0 0  Injury with Fall? 0 0 0 0 0  Risk for fall due to : No Fall Risks No Fall Risks No Fall Risks No Fall Risks No Fall Risks  Follow up Falls evaluation completed Falls evaluation completed   Falls evaluation completed    MEDICARE RISK AT HOME:  Medicare Risk at Home Any stairs in or around the home?: Yes (4 STEPS AT BACK  ENTRANCE OF HOME WITH RAILINGS) If so, are there any without handrails?: No Home free of loose throw rugs in walkways, pet beds, electrical cords, etc?: Yes Adequate lighting in your home to reduce risk of falls?: Yes Life alert?: No Use of a cane, walker or w/c?: No Grab bars in the bathroom?: No Shower chair or bench in shower?: No Elevated toilet seat or a handicapped toilet?: No  TIMED UP AND GO:  Was the test performed?  No  Cognitive Function: Declined/Normal: No cognitive concerns noted by patient or family. Patient alert, oriented, able to answer questions appropriately and recall recent events. No signs of memory loss or confusion.    05/15/2024    9:36 AM 12/12/2022   10:45 AM  MMSE - Mini Mental State Exam  Not completed: Unable to complete   Orientation to time  5  Orientation to Place  5  Registration  3  Attention/ Calculation  5  Recall  3  Language- name 2 objects  2  Language- repeat  1  Language- follow 3 step command  3  Language- read & follow direction  1  Write a sentence  1  Copy design  1  Total score  30        05/15/2024    9:31 AM  6CIT Screen  What Year? 0 points  What month? 0 points  What time? 0 points  Count back from 20 0 points  Months in reverse 0 points  Repeat phrase 0 points  Total Score 0 points    Immunizations Immunization History  Administered Date(s) Administered    sv, Bivalent, Protein Subunit Rsvpref,pf (Abrysvo) 03/19/2024   Fluad Quad(high Dose 65+) 06/09/2022   Hepatitis B 02/27/2016, 04/02/2016   Influenza,inj,Quad PF,6+ Mos 12/07/2021   Influenza-Unspecified 08/26/2016, 09/19/2017, 08/31/2018, 06/21/2023   PFIZER(Purple Top)SARS-COV-2 Vaccination 12/14/2019, 01/07/2020, 08/10/2020, 04/10/2021   PNEUMOCOCCAL CONJUGATE-20 06/09/2022   Pfizer(Comirnaty)Fall Seasonal Vaccine 12 years and older 05/26/2023, 03/07/2024   Tdap 02/27/2016   Zoster Recombinant(Shingrix ) 03/26/2021, 12/07/2021  Screening Tests Health  Maintenance  Topic Date Due   INFLUENZA VACCINE  04/12/2024   MAMMOGRAM  08/16/2024   Medicare Annual Wellness (AWV)  05/15/2025   DTaP/Tdap/Td (2 - Td or Tdap) 02/26/2026   Colonoscopy  05/29/2030   Pneumococcal Vaccine: 50+ Years  Completed   DEXA SCAN  Completed   COVID-19 Vaccine  Completed   Hepatitis C Screening  Completed   Zoster Vaccines- Shingrix   Completed   HPV VACCINES  Aged Out   Meningococcal B Vaccine  Aged Out   Hepatitis B Vaccines 19-59 Average Risk  Discontinued    Health Maintenance  Health Maintenance Due  Topic Date Due   INFLUENZA VACCINE  04/12/2024   Health Maintenance Items Addressed: Yes Patient is due for flu vaccine.  Additional Screening:  Vision Screening: Recommended annual ophthalmology exams for early detection of glaucoma and other disorders of the eye. Would you like a referral to an eye doctor? No    Dental Screening: Recommended annual dental exams for proper oral hygiene  Community Resource Referral / Chronic Care Management: CRR required this visit?  No   CCM required this visit?  No   Plan:    I have personally reviewed and noted the following in the patient's chart:   Medical and social history Use of alcohol, tobacco or illicit drugs  Current medications and supplements including opioid prescriptions. Patient is not currently taking opioid prescriptions. Functional ability and status Nutritional status Physical activity Advanced directives List of other physicians Hospitalizations, surgeries, and ER visits in previous 12 months Vitals Screenings to include cognitive, depression, and falls Referrals and appointments  In addition, I have reviewed and discussed with patient certain preventive protocols, quality metrics, and best practice recommendations. A written personalized care plan for preventive services as well as general preventive health recommendations were provided to patient.   Chelsea LOISE Fuller,  LPN   [id number]   After Visit Summary: (MyChart) Due to this being a telephonic visit, the after visit summary with patients personalized plan was offered to patient via MyChart   Notes: Nothing significant to report at this time.

## 2024-07-09 ENCOUNTER — Other Ambulatory Visit (INDEPENDENT_AMBULATORY_CARE_PROVIDER_SITE_OTHER): Payer: Self-pay | Admitting: Primary Care

## 2024-07-09 DIAGNOSIS — F330 Major depressive disorder, recurrent, mild: Secondary | ICD-10-CM

## 2024-07-09 DIAGNOSIS — E782 Mixed hyperlipidemia: Secondary | ICD-10-CM

## 2024-07-11 NOTE — Telephone Encounter (Signed)
 Requested Prescriptions  Refused Prescriptions Disp Refills   sertraline  (ZOLOFT ) 100 MG tablet [Pharmacy Med Name: SERTRALINE  100MG  TABLETS] 90 tablet 1    Sig: TAKE 1 TABLET(100 MG) BY MOUTH DAILY     Psychiatry:  Antidepressants - SSRI - sertraline  Passed - 07/11/2024 12:05 PM      Passed - AST in normal range and within 360 days    AST  Date Value Ref Range Status  02/26/2024 15 0 - 40 IU/L Final         Passed - ALT in normal range and within 360 days    ALT  Date Value Ref Range Status  02/26/2024 16 0 - 32 IU/L Final         Passed - Completed PHQ-2 or PHQ-9 in the last 360 days      Passed - Valid encounter within last 6 months    Recent Outpatient Visits           5 months ago Essential hypertension   [place] [facility] Celestia Rosaline SQUIBB, NP   10 months ago Need for hepatitis C screening test   [place] [facility] Celestia Rosaline SQUIBB, NP   1 year ago Encounter for Medicare annual wellness exam   [place] [facility] Celestia Rosaline SQUIBB, NP   1 year ago Estrogen deficiency   [place] [facility] Celestia Rosaline SQUIBB, NP   2 years ago Need for Streptococcus pneumoniae vaccination   [place] [facility] Celestia Rosaline SQUIBB, NP

## 2024-07-11 NOTE — Telephone Encounter (Signed)
 Requested Prescriptions  Refused Prescriptions Disp Refills   sertraline  (ZOLOFT ) 100 MG tablet [Pharmacy Med Name: SERTRALINE  100MG  TABLETS] 30 tablet     Sig: TAKE 1 TABLET(100 MG) BY MOUTH DAILY     Psychiatry:  Antidepressants - SSRI - sertraline  Passed - 07/11/2024  1:05 PM      Passed - AST in normal range and within 360 days    AST  Date Value Ref Range Status  02/26/2024 15 0 - 40 IU/L Final         Passed - ALT in normal range and within 360 days    ALT  Date Value Ref Range Status  02/26/2024 16 0 - 32 IU/L Final         Passed - Completed PHQ-2 or PHQ-9 in the last 360 days      Passed - Valid encounter within last 6 months    Recent Outpatient Visits           5 months ago Essential hypertension   [place] [facility] Celestia Rosaline SQUIBB, NP   10 months ago Need for hepatitis C screening test   [place] [facility] Celestia Rosaline SQUIBB, NP   1 year ago Encounter for Medicare annual wellness exam   [place] [facility] Celestia Rosaline SQUIBB, NP   1 year ago Estrogen deficiency   [place] [facility] Celestia Rosaline SQUIBB, NP   2 years ago Need for Streptococcus pneumoniae vaccination   [place] [facility] Celestia Rosaline SQUIBB, NP               atorvastatin  (LIPITOR) 10 MG tablet [Pharmacy Med Name: ATORVASTATIN  10MG  TABLETS] 90 tablet 1    Sig: TAKE 1 TABLET(10 MG) BY MOUTH DAILY     Cardiovascular:  Antilipid - Statins Failed - 07/11/2024  1:05 PM      Failed - Lipid Panel in normal range within the last 12 months    Cholesterol, Total  Date Value Ref Range Status  02/26/2024 178 100 - 199 mg/dL Final   LDL Chol Calc (NIH)  Date Value Ref Range Status  02/26/2024 115 (H) 0 - 99 mg/dL Final   HDL  Date Value Ref Range Status  02/26/2024 46 >39 mg/dL Final   Triglycerides  Date Value Ref Range Status  02/26/2024 95 0 - 149 mg/dL Final         Passed -  Patient is not pregnant      Passed - Valid encounter within last 12 months    Recent Outpatient Visits           5 months ago Essential hypertension   [place] [facility] Celestia Rosaline SQUIBB, NP   10 months ago Need for hepatitis C screening test   [place] [facility] Celestia Rosaline SQUIBB, NP   1 year ago Encounter for Medicare annual wellness exam   [place] [facility] Celestia Rosaline SQUIBB, NP   1 year ago Estrogen deficiency    [facility] Celestia Rosaline SQUIBB, NP   2 years ago Need for Streptococcus pneumoniae vaccination    [facility] Celestia Rosaline SQUIBB, NP

## 2024-07-15 ENCOUNTER — Encounter: Payer: Self-pay | Admitting: Radiology

## 2024-08-12 ENCOUNTER — Encounter (INDEPENDENT_AMBULATORY_CARE_PROVIDER_SITE_OTHER): Payer: Self-pay | Admitting: Primary Care

## 2024-08-12 ENCOUNTER — Ambulatory Visit (INDEPENDENT_AMBULATORY_CARE_PROVIDER_SITE_OTHER): Payer: Self-pay | Admitting: Primary Care

## 2024-08-12 VITALS — BP 134/80 | HR 59 | Resp 16 | Wt 193.6 lb

## 2024-08-12 DIAGNOSIS — E782 Mixed hyperlipidemia: Secondary | ICD-10-CM | POA: Diagnosis not present

## 2024-08-12 DIAGNOSIS — F330 Major depressive disorder, recurrent, mild: Secondary | ICD-10-CM

## 2024-08-12 DIAGNOSIS — Z23 Encounter for immunization: Secondary | ICD-10-CM

## 2024-08-12 DIAGNOSIS — I10 Essential (primary) hypertension: Secondary | ICD-10-CM

## 2024-08-12 MED ORDER — AMLODIPINE BESYLATE 10 MG PO TABS: ORAL_TABLET | ORAL | 1 refills | Status: AC

## 2024-08-12 MED ORDER — HYDROCHLOROTHIAZIDE 25 MG PO TABS: 25.0000 mg | ORAL_TABLET | Freq: Every morning | ORAL | 1 refills | Status: AC

## 2024-08-12 MED ORDER — ATORVASTATIN CALCIUM 10 MG PO TABS: ORAL_TABLET | ORAL | 1 refills | Status: AC

## 2024-08-12 MED ORDER — SERTRALINE HCL 100 MG PO TABS: ORAL_TABLET | ORAL | 1 refills | Status: AC

## 2024-08-12 NOTE — Patient Instructions (Signed)

## 2024-08-12 NOTE — Progress Notes (Signed)
 [facility]   Chelsea Walters is a 68 y.o. female presents for hypertension evaluation, Denies shortness of breath, headaches, chest pain or lower extremity edema, sudden onset, vision changes, unilateral weakness, dizziness, paresthesias   Patient reports adherence with medications.  Dietary habits include: reading label monitoring sodium intake  Exercise habits include:as tolerated with painful knees  Family / Social history: sister MI (age 16) mother diabetic    Past Medical History:  Diagnosis Date   Depression    High cholesterol    Hypertension    Past Surgical History:  Procedure Laterality Date   HAND SURGERY Left    No Known Allergies Current Outpatient Medications on File Prior to Visit  Medication Sig Dispense Refill   amLODipine  (NORVASC ) 10 MG tablet TAKE 1 TABLET(10 MG) BY MOUTH DAILY 30 tablet 1   atorvastatin  (LIPITOR) 10 MG tablet TAKE 1 TABLET(10 MG) BY MOUTH DAILY 90 tablet 1   hydrochlorothiazide  (HYDRODIURIL ) 25 MG tablet Take 1 tablet (25 mg total) by mouth every morning. 90 tablet 1   sertraline  (ZOLOFT ) 100 MG tablet TAKE 1 TABLET(100 MG) BY MOUTH DAILY 90 tablet 1   No current facility-administered medications on file prior to visit.   Social History   Socioeconomic History   Marital status: Widowed    Spouse name: Not on file   Number of children: Not on file   Years of education: Not on file   Highest education level: Bachelor's degree (e.g., BA, AB, BS)  Occupational History   Not on file  Tobacco Use   Smoking status: Never   Smokeless tobacco: Never  Substance and Sexual Activity   Alcohol use: Never   Drug use: Never   Sexual activity: Not Currently  Other Topics Concern   Not on file  Social History Narrative   Not on file   Social Drivers of Health   Financial Resource Strain: Low Risk  (08/08/2024)   Overall Financial Resource Strain (CARDIA)    Difficulty of Paying Living Expenses: Not very hard  Food  Insecurity: No Food Insecurity (08/08/2024)   Hunger Vital Sign    Worried About Running Out of Food in the Last Year: Never true    Ran Out of Food in the Last Year: Never true  Transportation Needs: No Transportation Needs (08/08/2024)   PRAPARE - Administrator, Civil Service (Medical): No    Lack of Transportation (Non-Medical): No  Physical Activity: Insufficiently Active (08/08/2024)   Exercise Vital Sign    Days of Exercise per Week: 3 days    Minutes of Exercise per Session: 30 min  Stress: Stress Concern Present (08/08/2024)   [employer] of Occupational Health - Occupational Stress Questionnaire    Feeling of Stress: To some extent  Social Connections: Moderately Isolated (08/08/2024)   Social Connection and Isolation Panel    Frequency of Communication with Friends and Family: More than three times a week    Frequency of Social Gatherings with Friends and Family: Twice a week    Attends Religious Services: 1 to 4 times per year    Active Member of [employer] or Organizations: No    Attends Banker Meetings: Not on file    Marital Status: Widowed  Intimate Partner Violence: Not At Risk (05/15/2024)   Humiliation, Afraid, Rape, and Kick questionnaire    Fear of Current or Ex-Partner: No    Emotionally Abused: No    Physically Abused: No    Sexually Abused: No  No family history on file. Health Maintenance  Topic Date Due   COVID-19 Vaccine (7 - 2025-26 season) 05/13/2024   Mammogram  08/16/2024   Medicare Annual Wellness (AWV)  05/15/2025   DTaP/Tdap/Td (2 - Td or Tdap) 02/26/2026   Colonoscopy  05/29/2030   Pneumococcal Vaccine: 50+ Years  Completed   Influenza Vaccine  Completed   Bone Density Scan  Completed   Hepatitis C Screening  Completed   Zoster Vaccines- Shingrix   Completed   Meningococcal B Vaccine  Aged Out   Hepatitis B Vaccines 19-59 Average Risk  Discontinued     OBJECTIVE:  Vitals:   08/12/24 1009  BP: 134/80   Pulse: (!) 59  Resp: 16  SpO2: 98%  Weight: 193 lb 9.6 oz (87.8 kg)    Physical Exam Vitals reviewed.  Constitutional:      Appearance: Normal appearance. She is obese.  HENT:     Head: Normocephalic.     Right Ear: Tympanic membrane, ear canal and external ear normal.     Left Ear: Tympanic membrane, ear canal and external ear normal.     Nose: Nose normal.     Mouth/Throat:     Mouth: Mucous membranes are moist.  Eyes:     Extraocular Movements: Extraocular movements intact.     Pupils: Pupils are equal, round, and reactive to light.  Cardiovascular:     Rate and Rhythm: Normal rate.  Pulmonary:     Effort: Pulmonary effort is normal.     Breath sounds: Normal breath sounds.  Abdominal:     General: Bowel sounds are normal.     Palpations: Abdomen is soft.  Musculoskeletal:        General: Normal range of motion.     Cervical back: Normal range of motion.  Skin:    General: Skin is warm and dry.  Neurological:     Mental Status: She is alert and oriented to person, place, and time.  Psychiatric:        Mood and Affect: Mood normal.        Behavior: Behavior normal.        Thought Content: Thought content normal.      ROS  Last 3 Office BP readings: BP Readings from Last 3 Encounters:  08/12/24 134/80  02/08/24 136/76  08/28/23 133/84    BMET    Component Value Date/Time   NA 140 02/26/2024 0858   K 4.3 02/26/2024 0858   CL 101 02/26/2024 0858   CO2 22 02/26/2024 0858   GLUCOSE 74 02/26/2024 0858   BUN 16 02/26/2024 0858   CREATININE 0.68 02/26/2024 0858   CALCIUM  9.5 02/26/2024 0858   GFRNONAA 75 11/02/2020 1421   GFRAA 87 11/02/2020 1421    Renal function: CrCl cannot be calculated (Patient's most recent lab result is older than the maximum 21 days allowed.).  Clinical ASCVD: Yes  The 10-year ASCVD risk score (Arnett DK, et al., 2019) is: 10.3%   Values used to calculate the score:     Age: 34 years     Clincally relevant sex: Female      Is Non-Hispanic African American: Yes     Diabetic: No     Tobacco smoker: No     Systolic Blood Pressure: 134 mmHg     Is BP treated: Yes     HDL Cholesterol: 46 mg/dL     Total Cholesterol: 178 mg/dL  ASCVD risk factors include- CHAD   ASSESSMENT & PLAN:  Charnita was seen today for hypertension.  Diagnoses and all orders for this visit:  Encounter for immunization -     Flu vaccine HIGH DOSE PF(Fluzone Trivalent)  Mixed hyperlipidemia On atorvastatin    Healthy lifestyle diet of fruits vegetables fish nuts whole grains and low saturated fat . Foods high in cholesterol or liver, fatty meats,cheese, butter avocados, nuts and seeds, chocolate and fried foods.     Essential hypertension -Counseled on lifestyle modifications for blood pressure control including reduced dietary sodium, increased exercise, weight reduction and adequate sleep. Also, educated patient about the risk for cardiovascular events, stroke and heart attack. Also counseled patient about the importance of medication adherence. If you participate in smoking, it is important to stop using tobacco as this will increase the risks associated with uncontrolled blood pressure.  Goal BP:  For patients younger than 60: Goal BP < 130/80. For patients 60 and older: Goal BP < 140/90. For patients with diabetes: Goal BP < 130/80. Your most recent BP: 134/80  Minimize salt intake. Minimize alcohol intake  Mild episode of recurrent major depressive disorder -     sertraline  (ZOLOFT ) 100 MG tablet; TAKE 1 TABLET(100 MG) BY MOUTH DAILY    This note has been created with Education officer, environmental. Any transcriptional errors are unintentional.   Rosaline SHAUNNA Bohr, NP 08/12/2024, 10:48 AM

## 2024-08-13 LAB — CMP14+EGFR
ALT: 13 IU/L (ref 0–32)
AST: 14 IU/L (ref 0–40)
Albumin: 4.4 g/dL (ref 3.9–4.9)
Alkaline Phosphatase: 49 IU/L (ref 49–135)
BUN/Creatinine Ratio: 18 (ref 12–28)
BUN: 13 mg/dL (ref 8–27)
Bilirubin Total: 0.5 mg/dL (ref 0.0–1.2)
CO2: 25 mmol/L (ref 20–29)
Calcium: 9.8 mg/dL (ref 8.7–10.3)
Chloride: 101 mmol/L (ref 96–106)
Creatinine, Ser: 0.72 mg/dL (ref 0.57–1.00)
Globulin, Total: 3.3 g/dL (ref 1.5–4.5)
Glucose: 79 mg/dL (ref 70–99)
Potassium: 4.6 mmol/L (ref 3.5–5.2)
Sodium: 140 mmol/L (ref 134–144)
Total Protein: 7.7 g/dL (ref 6.0–8.5)
eGFR: 92 mL/min/1.73 (ref >59)

## 2024-08-13 LAB — LIPID PANEL
Chol/HDL Ratio: 3.2 ratio (ref 0.0–4.4)
Cholesterol, Total: 151 mg/dL (ref 100–199)
HDL: 47 mg/dL (ref >39)
LDL Chol Calc (NIH): 89 mg/dL (ref 0–99)
Triglycerides: 77 mg/dL (ref 0–149)
VLDL Cholesterol Cal: 15 mg/dL (ref 5–40)

## 2024-08-29 ENCOUNTER — Ambulatory Visit (INDEPENDENT_AMBULATORY_CARE_PROVIDER_SITE_OTHER): Payer: Self-pay | Admitting: Primary Care

## 2025-05-20 ENCOUNTER — Ambulatory Visit
